# Patient Record
Sex: Female | Born: 1935 | Race: Black or African American | Hispanic: No | Smoking: Never smoker
Health system: Southern US, Community
[De-identification: ages and names within clinical notes are randomized; demographics above are authoritative.]

## PROBLEM LIST (undated history)

## (undated) DIAGNOSIS — E7800 Pure hypercholesterolemia, unspecified: Secondary | ICD-10-CM

## (undated) DIAGNOSIS — F0280 Dementia in other diseases classified elsewhere without behavioral disturbance: Secondary | ICD-10-CM

## (undated) DIAGNOSIS — G309 Alzheimer's disease, unspecified: Secondary | ICD-10-CM

## (undated) DIAGNOSIS — I10 Essential (primary) hypertension: Secondary | ICD-10-CM

## (undated) DIAGNOSIS — I209 Angina pectoris, unspecified: Secondary | ICD-10-CM

## (undated) DIAGNOSIS — I509 Heart failure, unspecified: Secondary | ICD-10-CM

## (undated) HISTORY — PX: ABDOMINAL HYSTERECTOMY: SHX81

## (undated) HISTORY — DX: Heart failure, unspecified: I50.9

## (undated) HISTORY — PX: THYROID LOBECTOMY: SHX420

## (undated) HISTORY — DX: Essential (primary) hypertension: I10

---

## 2013-07-18 ENCOUNTER — Emergency Department (HOSPITAL_BASED_OUTPATIENT_CLINIC_OR_DEPARTMENT_OTHER)
Admission: EM | Admit: 2013-07-18 | Discharge: 2013-07-18 | Disposition: A | Discharge status: Home / Self Care | Payer: Medicare PPO | Attending: Emergency Medicine | Admitting: Emergency Medicine

## 2013-07-18 ENCOUNTER — Encounter (HOSPITAL_BASED_OUTPATIENT_CLINIC_OR_DEPARTMENT_OTHER): Payer: Self-pay | Admitting: *Deleted

## 2013-07-18 ENCOUNTER — Emergency Department (HOSPITAL_BASED_OUTPATIENT_CLINIC_OR_DEPARTMENT_OTHER): Payer: Medicare PPO

## 2013-07-18 DIAGNOSIS — M25559 Pain in unspecified hip: Secondary | ICD-10-CM | POA: Insufficient documentation

## 2013-07-18 DIAGNOSIS — M25473 Effusion, unspecified ankle: Secondary | ICD-10-CM | POA: Insufficient documentation

## 2013-07-18 DIAGNOSIS — M25476 Effusion, unspecified foot: Secondary | ICD-10-CM | POA: Insufficient documentation

## 2013-07-18 DIAGNOSIS — M79604 Pain in right leg: Secondary | ICD-10-CM

## 2013-07-18 DIAGNOSIS — M79609 Pain in unspecified limb: Secondary | ICD-10-CM | POA: Insufficient documentation

## 2013-07-18 MED ORDER — ACETAMINOPHEN 325 MG PO TABS
650.0000 mg | ORAL_TABLET | Freq: Once | ORAL | Status: AC
  Administered 2013-07-18: 650 mg via ORAL
  Filled 2013-07-18: qty 2

## 2013-07-18 NOTE — Discharge Instructions (Signed)
RESOURCE GUIDE ° °Chronic Pain Problems: °Contact [facility]  [phone] °Patients need to be referred by their primary care doctor. ° °Insufficient Money for Medicine: °Contact United Way:  call (888) 892-1162 ° °No Primary Care Doctor: °- Call [facility]  [phone] - can help you locate a primary care doctor that  accepts your insurance, provides certain services, etc. °- Physician Referral Service- 1-800-533-3463 ° °Agencies that provide inexpensive medical care: °- [facility]  [phone] °- [facility]  [phone] °- [facility]  [phone] °- [facility]  832-4777 °- [facility]  [phone] °- [facility]  272-1050 ° °Medicaid-accepting [place] Providers: °- [facility]- [address] ° [phone], Mon-Fri 9am-7pm, Sat 9am-1pm °- [facility]- [address], [address] ° [phone] °- [facility]- [address], [address] ° [phone] °- [facility]- [address] [place] [address] ° [phone] °- [facility]- [address], [phone] ° Only accepts [place] Access Medicaid patients after they have their name  applied to their card ° °Self Pay (no insurance) in [place]: °- Sickle Cell Patients - [facility] ° [address], [phone] °- [facility]- [address] ° [phone] °      -     [facility]- [address] °      -     [facility]- see information above (Speak to Pam H if you do not have insurance) °      -  [facility] [place]- [address],  [phone] °      -  [facility]- [address] [place] [address], [phone] °      -  Dr Osei-Bonsu-  [address], [place], [phone] °      -  [facility] - [address], [phone] °      -  [facility]- [address] [place] [address], [phone], also  [address], [phone] °      -     [facility]- [address], [phone], 1st & 3rd Saturday °        every month, 10am-1pm ° -     [facility] °  [address], [place]. °  Phone:  [phone], Fax:  [phone]. Hours of Operation:  9 am - 6 pm, M-F. ° -     [facility] °  [address], [place] °  Phone: [phone], Fax: [phone]. Hours of Operation:  8:30 am - 5:30 pm, M-F. ° °[facility] °[address] °[place], [place] [address] °(336) 832-4777 ° °[facility] °[address] °[place], [place] [address] °(336) [phone] ° °1) Find a Doctor and Pay Out of Pocket °Although you won't have to find out who is covered by your insurance plan, it is a good idea to ask around and get recommendations. You will then need to call the office and see if the doctor you have chosen will accept you as a new patient and what types of options they offer for patients who are self-pay. Some doctors offer discounts or will set up payment plans for their patients who do not have insurance, but   you will need to ask so you aren't surprised when you get to your appointment. ° °2) Contact Your Local Health Department °Not all health departments have doctors that can see patients for sick visits, but many do, so it is worth a call to see if yours does. If you don't know where your local health department is, you can check in your phone book. The CDC also has a tool to help you locate your state's health department, and many state websites also have listings of all of their local health departments. ° °3) Find a Walk-in Clinic °If your illness is not likely to be very severe or complicated, you may want to try a walk in clinic. These are popping up all over the country in pharmacies, drugstores, and shopping centers. They're usually staffed by nurse practitioners or physician assistants that have been trained  to treat common illnesses and complaints. They're usually fairly quick and inexpensive. However, if you have serious medical issues or chronic medical problems, these are probably not your best option ° °STD Testing °- [place] [facility] [place], [facility], [address], [place], phone [phone] or [phone].  Monday - Friday, call for an appointment. °- [place] [facility] [place], [facility], [address], [place], phone [phone] or [phone].  Monday - Friday, call for an appointment. ° °Abuse/Neglect: °- [place] Child Abuse Hotline (336) 641-3795 °- [place] Child Abuse Hotline 800-378-5315 (After Hours) ° °Emergency Shelter:  [facility] (336) 271-5985 ° °Maternity Homes: °- [facility] (336) 275-9566 °- [facility] (704) 372-4663 ° °MRSA Hotline #:   832-7006 ° °Dental Assistance °If unable to pay or uninsured, contact:  [place] [facility] to become qualified for the adult dental clinic. ° °Patients with Medicaid: [facility] °[address], [phone] °[address], [phone] ° °If unable to pay, or uninsured, contact [place] [facility] ([phone] in [place], 842-7733 in [place]) to become qualified for the adult dental clinic ° °[facility] °[address] °[place], [place] [address] °(336) 272-4177 °www.[facility].com ° °Other Low-Cost Community Dental Services: °- [facility]- [address], [place], [place], [address], 723-1848, Ext. 123, 2nd and 4th Thursday of the month at 6:30am.  10 clients each day by appointment, can sometimes see walk-in patients if someone does not show for an appointment. °- [facility]- [address], [place], [place], [address], [phone] °- [facility]- [address], [place], [place], [address], [phone] °- [place]  [facility]- 342-8273 °- [facility]- 703-3100 °- [facility]- 570-6415 ° °     Behavioral Health Resources in the Community ° °Intensive Outpatient Programs: °[facility]      °[address] °[place], [place] °336-878-6098 °Both a day and evening program °      °[facility]     °[address]        °[place], [place] [address] °336-832-9800        ° °[facility] °[address] °[place] °336-882-2125 ° °[facility] °ACCESS LINE: 1-800-853-5163 or 336-641-4981 °[address] °[place], [place] [address] °Http://[email or web address][email or web address]/adult.htm ° ° °Substance Abuse Resources: °- [facility]  336-882-2125 °- [facility] 336-784-9470 °- [facility] 336-285-9073 °- [facility] 336-845-3988 °-   Residential & Outpatient Substance Abuse Program  800-659-3381 ° °Psychological Services: °- [facility]  832-9600 °- [facility]  378-7881 °- [facility], [address], [place], ACCESS LINE: 1-800-853-5163 or 336-641-4981, Http://www.[facility].com/services/adult.htm ° °Mobile Crisis Teams:         °                               °[facility]         °Mobile Crisis Care Unit °1-877-626-1772       °      °Assertive °[facility] °[address] [place] °336-834-9664 °                                        °Interventionist °Sharon DeEsch °[address] °[place] °336-554-5454 ° °Self-Help/Support Groups: °[facility] Variety of support groups °373-1402 (call for more info) ° °Narcotics Anonymous (NA) °[facility] °[address] °[place] - 2 meetings at this location ° °Residential Treatment Programs:  °[facility]      °[address]        °[place]       °866-801-8205        ° °[facility] °[address] °[place], [place]  [address] °704-293-8524 ° °[facility]  °[address] °[place], [place] [address] °336-845-3988 °Admissions: 8am-3pm M-F ° °[facility]     °[address]        °[place], [place] [address]       °336-841-1104        ° °[facility] °[address] °[place], [place] °336-379-7146 ° °[facility] °[address] °[place], [place] °336-285-9073 ° °[facility] - Intensive Outpatient      °[address]     °[place], [place]       °852-3033        ° °[facility]     °[address] °[place], [place] °877-615-2722 or 336-784-9470 ° °[facility]), Medicaid °[address] °[place], [place] °336-227-7417 ° °[facility]                                               °[address] °[place] °800-659-3381 ° °[place] [facility]: °[facility]- 1-888-581-9988              ° °General Therapy                                                °Julie Brannon, PhD        °[address]                                       °[place], [place] [address]         °336-349-5553   °Insurance ° °[facility]   °[address] °[place], [place] [address] °336-349-4454 ° °[facility] °[address] [place], [place] [address] °336-342-8316 °Insurance/Medicaid/sponsorship through [facility] ° °[facility]                                              °[address]                                        °[place], [place] [address]    °Therapy/tele-psych/case         °336-342-8316        °  °[facility] °[address]  ° [place], [place]  [address]  °Adolescent/group home/case management °336-349-2233  °                                         °Julia Brannon PhD       °General therapy       °Insurance   °336-951-0000        ° °Dr. Arfeen, Insurance, M-F °336- 349-4544 ° °[facility] [place]  [facility]  [employer] [place] [facility] °[address]                 [address]         [address]  °[place]                                               [place] °Phone:  349-3220                                  Phone:  [phone]                   Phone:  342-8140 ° °[facility], 342-8316 °- [place] [facility] - [facility]- 1-888-581-9988 °      -     [facility] in [place], [address], °            336-349-4454, Insurance ° °[place] Child Abuse Hotline °(336) 342-1394 or (336) 342-3537 (After Hours) ° ° °

## 2013-07-18 NOTE — ED Provider Notes (Signed)
CSN: [id number]     Arrival date & time 07/18/13  1353 History   First MD Initiated Contact with Patient 07/18/13 1459     Chief Complaint  Patient presents with  . Leg Pain   (Consider location/radiation/quality/duration/timing/severity/associated sxs/prior Treatment) Patient is a 79 y.o. female presenting with leg pain.  Leg Pain Location:  Hip Lower extremity injury: Several months ago.   Hip location:  R hip Pain details:    Quality:  Aching   Radiates to:  Does not radiate   Severity:  Moderate   Onset quality:  Gradual   Timing:  Intermittent   Progression:  Unchanged Chronicity:  Chronic Relieved by:  Nothing Worsened by:  Bearing weight and activity Ineffective treatments: Aspirin. Associated symptoms: no back pain, no fever, no muscle weakness, no swelling and no tingling     History reviewed. No pertinent past medical history. History reviewed. No pertinent past surgical history. No family history on file. History  Substance Use Topics  . Smoking status: Never Smoker   . Smokeless tobacco: Not on file  . Alcohol Use: No   OB History   Grav Para Term Preterm Abortions TAB SAB Ect Mult Living                 Review of Systems  Constitutional: Negative for fever.  HENT: Negative for congestion.   Respiratory: Negative for cough and shortness of breath.   Cardiovascular: Negative for chest pain.  Gastrointestinal: Negative for nausea, vomiting, abdominal pain and diarrhea.  Musculoskeletal: Negative for back pain.  All other systems reviewed and are negative.    Allergies  Review of patient's allergies indicates no known allergies.  Home Medications  No current outpatient prescriptions on file. BP 142/51  Pulse 70  Temp(Src) 98.1 F (36.7 C) (Oral)  Resp 18  Ht 5\' 3"  (1.6 m)  Wt 150 lb (68.04 kg)  BMI 26.58 kg/m2  SpO2 99% Physical Exam  Nursing note and vitals reviewed. Constitutional: She is oriented to person, place, and time. She appears  well-developed and well-nourished. No distress.  HENT:  Head: Normocephalic and atraumatic.  Eyes: Conjunctivae are normal. No scleral icterus.  Neck: Neck supple.  Cardiovascular: Normal rate and intact distal pulses.   Pulmonary/Chest: Effort normal. No stridor. No respiratory distress.  Abdominal: Normal appearance. She exhibits no distension.  Musculoskeletal:       Right hip: She exhibits tenderness. She exhibits normal range of motion, normal strength, no swelling, no crepitus, no deformity (Normal distal pulses. Normal distal strength and sensation.) and no laceration.       Right ankle: She exhibits swelling. She exhibits normal range of motion, no deformity and normal pulse. No tenderness.  Neurological: She is alert and oriented to person, place, and time.  Skin: Skin is warm and dry. No rash noted.  Psychiatric: She has a normal mood and affect. Her behavior is normal.    ED Course  Procedures (including critical care time) Labs Review Labs Reviewed - No data to display Imaging Review Dg Hip Complete Right  07/18/2013   *RADIOLOGY REPORT*  Clinical Data: Right hip and leg pain, fell a while back  RIGHT HIP - COMPLETE 2+ VIEW  Comparison: None  Findings: Hip and SI joints symmetric and preserved. Bones appear demineralized. No acute fracture, dislocation or bone destruction.  IMPRESSION: No acute osseous abnormalities.   Original Report Authenticated By: Ulyses Southward, M.D.  All radiology studies independently viewed by me.     MDM  1. Right leg pain    79 year old female who was brought to the ED by her son do to right hip pain and because she does not have a primary care provider. Her right hip has been bothering her intermittently for many months. She thinks it was secondary to a fall that she is up sustained some time ago. No deformities, normal pulses, normal sensation and motor. She does have some swelling in her ankle, but she is unsure of the duration of this. She has no  pain or tenderness here. swElling is not consistent with DVT. Plan to check a plain film of right hip and unclear history and report of fall. However anticipate that she will need to establish care with a primary care doctor.  Plain films negative. Tylenol for pain.  Will dc home with resources for a primary doctor.  Candyce Churn, MD 07/18/13 [phone]

## 2013-07-18 NOTE — ED Notes (Signed)
Reports of Pt. Fell in Jan. 2014 and now the pain in the R upper leg is getting worse.  Pt. Does have edema noted in the R and L lower legs and ankles.  More edema noted in the R leg.  Pt. Does walk with pain in the  R leg.

## 2013-08-06 ENCOUNTER — Other Ambulatory Visit: Payer: Self-pay | Admitting: Obstetrics

## 2013-08-06 DIAGNOSIS — N390 Urinary tract infection, site not specified: Secondary | ICD-10-CM

## 2013-08-06 MED ORDER — NITROFURANTOIN MONOHYD MACRO 100 MG PO CAPS: 100.0000 mg | ORAL_CAPSULE | Freq: Two times a day (BID) | ORAL | Status: DC

## 2015-05-05 ENCOUNTER — Emergency Department (HOSPITAL_COMMUNITY)
Admission: EM | Admit: 2015-05-05 | Discharge: 2015-05-05 | Disposition: A | Discharge status: Home / Self Care | Payer: Medicare PPO | Attending: Emergency Medicine | Admitting: Emergency Medicine

## 2015-05-05 ENCOUNTER — Encounter (HOSPITAL_COMMUNITY): Payer: Self-pay | Admitting: Emergency Medicine

## 2015-05-05 DIAGNOSIS — G309 Alzheimer's disease, unspecified: Secondary | ICD-10-CM | POA: Diagnosis not present

## 2015-05-05 DIAGNOSIS — E780 Pure hypercholesterolemia: Secondary | ICD-10-CM | POA: Insufficient documentation

## 2015-05-05 DIAGNOSIS — F0390 Unspecified dementia without behavioral disturbance: Secondary | ICD-10-CM

## 2015-05-05 DIAGNOSIS — Z79899 Other long term (current) drug therapy: Secondary | ICD-10-CM | POA: Diagnosis not present

## 2015-05-05 DIAGNOSIS — Z8679 Personal history of other diseases of the circulatory system: Secondary | ICD-10-CM | POA: Diagnosis not present

## 2015-05-05 DIAGNOSIS — R451 Restlessness and agitation: Secondary | ICD-10-CM | POA: Diagnosis present

## 2015-05-05 DIAGNOSIS — F0280 Dementia in other diseases classified elsewhere without behavioral disturbance: Secondary | ICD-10-CM | POA: Diagnosis not present

## 2015-05-05 DIAGNOSIS — Z7982 Long term (current) use of aspirin: Secondary | ICD-10-CM | POA: Diagnosis not present

## 2015-05-05 HISTORY — DX: Alzheimer's disease, unspecified: G30.9

## 2015-05-05 HISTORY — DX: Dementia in other diseases classified elsewhere, unspecified severity, without behavioral disturbance, psychotic disturbance, mood disturbance, and anxiety: F02.80

## 2015-05-05 HISTORY — DX: Angina pectoris, unspecified: I20.9

## 2015-05-05 HISTORY — DX: Pure hypercholesterolemia, unspecified: E78.00

## 2015-05-05 LAB — COMPREHENSIVE METABOLIC PANEL
ALK PHOS: 100 U/L (ref 38–126)
ALT: 13 U/L — AB (ref 14–54)
AST: 23 U/L (ref 15–41)
Albumin: 4.1 g/dL (ref 3.5–5.0)
Anion gap: 11 (ref 5–15)
BILIRUBIN TOTAL: 0.7 mg/dL (ref 0.3–1.2)
BUN: 14 mg/dL (ref 6–20)
CHLORIDE: 105 mmol/L (ref 101–111)
CO2: 25 mmol/L (ref 22–32)
Calcium: 9.3 mg/dL (ref 8.9–10.3)
Creatinine, Ser: 0.67 mg/dL (ref 0.44–1.00)
GLUCOSE: 90 mg/dL (ref 65–99)
POTASSIUM: 3.2 mmol/L — AB (ref 3.5–5.1)
Sodium: 141 mmol/L (ref 135–145)
TOTAL PROTEIN: 7.9 g/dL (ref 6.5–8.1)

## 2015-05-05 LAB — CBC WITH DIFFERENTIAL/PLATELET
BASOS ABS: 0 10*3/uL (ref 0.0–0.1)
BASOS PCT: 1 % (ref 0–1)
EOS PCT: 2 % (ref 0–5)
Eosinophils Absolute: 0.1 10*3/uL (ref 0.0–0.7)
HEMATOCRIT: 41.9 % (ref 36.0–46.0)
HEMOGLOBIN: 13.3 g/dL (ref 12.0–15.0)
Lymphocytes Relative: 30 % (ref 12–46)
Lymphs Abs: 1.8 10*3/uL (ref 0.7–4.0)
MCH: 28.3 pg (ref 26.0–34.0)
MCHC: 31.7 g/dL (ref 30.0–36.0)
MCV: 89.1 fL (ref 78.0–100.0)
MONO ABS: 0.7 10*3/uL (ref 0.1–1.0)
MONOS PCT: 11 % (ref 3–12)
Neutro Abs: 3.3 10*3/uL (ref 1.7–7.7)
Neutrophils Relative %: 56 % (ref 43–77)
Platelets: 330 10*3/uL (ref 150–400)
RBC: 4.7 MIL/uL (ref 3.87–5.11)
RDW: 13.4 % (ref 11.5–15.5)
WBC: 5.8 10*3/uL (ref 4.0–10.5)

## 2015-05-05 LAB — URINALYSIS, ROUTINE W REFLEX MICROSCOPIC
Glucose, UA: NEGATIVE mg/dL
HGB URINE DIPSTICK: NEGATIVE
KETONES UR: NEGATIVE mg/dL
LEUKOCYTES UA: NEGATIVE
Nitrite: NEGATIVE
Protein, ur: NEGATIVE mg/dL
SPECIFIC GRAVITY, URINE: 1.027 (ref 1.005–1.030)
UROBILINOGEN UA: 1 mg/dL (ref 0.0–1.0)
pH: 5.5 (ref 5.0–8.0)

## 2015-05-05 LAB — RAPID URINE DRUG SCREEN, HOSP PERFORMED
Amphetamines: NOT DETECTED
BARBITURATES: NOT DETECTED
Benzodiazepines: NOT DETECTED
COCAINE: NOT DETECTED
OPIATES: NOT DETECTED
Tetrahydrocannabinol: NOT DETECTED

## 2015-05-05 LAB — ETHANOL

## 2015-05-05 NOTE — ED Notes (Signed)
Pt with daughter, daughter states pt has been becoming very agitated and running away from home. Pt lives with daughter. Pt calm and cooperative in triage.

## 2015-05-05 NOTE — BH Assessment (Signed)
Writer informed TTS ([place]) of the consult.  
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
## 2015-05-05 NOTE — ED Provider Notes (Signed)
CSN: [id number]     Arrival date & time 05/05/15  6606 History   First MD Initiated Contact with Patient 05/05/15 0957     Chief Complaint  Patient presents with  . Agitation     (Consider location/radiation/quality/duration/timing/severity/associated sxs/prior Treatment) Patient is a 81 y.o. female presenting with altered mental status. The history is provided by a relative (the daughter states the pt wanted to hurt someone).  Altered Mental Status Presenting symptoms: behavior changes   Severity:  Moderate Most recent episode:  Today Episode history:  Multiple Timing:  Intermittent Chronicity:  Recurrent Context: dementia   Associated symptoms: agitation   Associated symptoms: no abdominal pain, no headaches, no rash and no seizures     Past Medical History  Diagnosis Date  . Alzheimer disease   . Anginal pain   . Hypercholesteremia    Past Surgical History  Procedure Laterality Date  . Abdominal hysterectomy     No family history on file. History  Substance Use Topics  . Smoking status: Never Smoker   . Smokeless tobacco: Not on file  . Alcohol Use: No   OB History    No data available     Review of Systems  Constitutional: Negative for appetite change and fatigue.  HENT: Negative for congestion, ear discharge and sinus pressure.   Eyes: Negative for discharge.  Respiratory: Negative for cough.   Cardiovascular: Negative for chest pain.  Gastrointestinal: Negative for abdominal pain and diarrhea.  Genitourinary: Negative for frequency and hematuria.  Musculoskeletal: Negative for back pain.  Skin: Negative for rash.  Neurological: Negative for seizures and headaches.  Psychiatric/Behavioral: Positive for agitation.      Allergies  Review of patient's allergies indicates no known allergies.  Home Medications   Prior to Admission medications   Medication Sig Start Date End Date Taking? Authorizing Provider  aspirin 81 MG tablet Take 81 mg by mouth  daily.   Yes Historical Provider, MD  donepezil (ARICEPT) 5 MG tablet Take 5 mg by mouth daily. 04/19/15  Yes Historical Provider, MD  pravastatin (PRAVACHOL) 10 MG tablet Take 10 mg by mouth daily. 02/21/15  Yes Historical Provider, MD  zolpidem (AMBIEN) 10 MG tablet Take 5-10 mg by mouth at bedtime as needed for sleep.  04/27/15  Yes Historical Provider, MD  nitrofurantoin, macrocrystal-monohydrate, (MACROBID) 100 MG capsule Take 1 capsule (100 mg total) by mouth 2 (two) times daily. Patient not taking: Reported on 05/05/2015 08/06/13   Brock Bad, MD   BP 142/40 mmHg  Pulse 77  Temp(Src) 98.3 F (36.8 C) (Oral)  Resp 16  SpO2 97% Physical Exam  Constitutional: She is oriented to person, place, and time. She appears well-developed.  HENT:  Head: Normocephalic.  Eyes: Conjunctivae and EOM are normal. No scleral icterus.  Neck: Neck supple. No thyromegaly present.  Cardiovascular: Normal rate and regular rhythm.  Exam reveals no gallop and no friction rub.   No murmur heard. Pulmonary/Chest: No stridor. She has no wheezes. She has no rales. She exhibits no tenderness.  Abdominal: She exhibits no distension. There is no tenderness. There is no rebound.  Musculoskeletal: Normal range of motion. She exhibits no edema.  Lymphadenopathy:    She has no cervical adenopathy.  Neurological: She is oriented to person, place, and time. She exhibits normal muscle tone. Coordination normal.  Skin: No rash noted. No erythema.  Psychiatric:  Pt aggitated    ED Course  Procedures (including critical care time) Labs Review Labs Reviewed  COMPREHENSIVE METABOLIC PANEL - Abnormal; Notable for the following:    Potassium 3.2 (*)    ALT 13 (*)    All other components within normal limits  URINALYSIS, ROUTINE W REFLEX MICROSCOPIC (NOT AT [facility]) - Abnormal; Notable for the following:    Color, Urine AMBER (*)    APPearance CLOUDY (*)    Bilirubin Urine SMALL (*)    All other components within  normal limits  CBC WITH DIFFERENTIAL/PLATELET  URINE RAPID DRUG SCREEN, HOSP PERFORMED  ETHANOL    Imaging Review No results found.   EKG Interpretation None      MDM   Final diagnoses:  Dementia, without behavioral disturbance    Pt seen by Child psychotherapist.   Pt with dementia  And will follow up with pcp   Bethann Berkshire, MD 05/05/15 1539

## 2015-05-05 NOTE — ED Notes (Signed)
MD at bedside. EDP ZAMMIT 

## 2015-05-05 NOTE — Progress Notes (Addendum)
Pt in room with daughter at bedside Pt states she is here in [place] with daughter,  Dois Davenport "my oldest and only daughter' States she lives with Dois Davenport after she left her home States she and her mother lived with Dois Davenport until she her mother passed.  Can not recall the date of today but can recall she is at "the hospital" unable to tell CM what hospital.  Unable to tell pt the date but told Cm she went to a "all girls school in [place]" Pt can not recall why she is in ED Pt mentioned before cm could redirect her that "some people think I am losing my mind and I'm going crazy" Pt apologized to daughter stating she "brought this to you and your home"  Pt briefly tearful  Daughter sitting in room quietly but observing and correcting mother answers as needed Cm shared with pt and daughter that generally neurologist can assess for memory losses Dois Davenport reports pt has not been seen by a neurologist yet.  Pt states she will be willing to see a neurologist and answer questions but "mya get a little quiet when I don't want to answer questions. I can get an attitude"  PCP updated as brian mackenzie (offered by sandra pt could not recall pcp name States she had one last in last home town)   1534 Cm sent pcp a noted vis [facility] in basket about ED visit, HH orders and possible need for neurology referral Cm left a voice message for Caryn Bee about [facility] /SW Pending call Request Daughter sandra be called Left her number Provided pt/daughter with list of [facility] medicare PPO neurologist Instructed Dois Davenport with how to get to find a doctor for [employer] PPO and gave her copies of directions for future use Reports she has pt set up with [facility] 2 times a week  Updated EDP  1617 return call from [place] stating they are not able to provide home service for humana pt.   1740 CM called and left a message at [phone] informing Dois Davenport that [facility] not able to provide home service for humana pt CM encouraged her to  return call to cm office number to provide other choice of [facility] agency

## 2015-05-05 NOTE — Discharge Instructions (Signed)
Follow up with your family md and follow up with the social work help

## 2015-05-05 NOTE — Progress Notes (Signed)
1111 CM consulted by ED Sw about needed UA for pt  Cm spoke with Dr Estell Harpin Noted entered UA at 1110

## 2015-05-05 NOTE — ED Notes (Signed)
Unable to obtain labs at this time, ACT team is in the room speaking with her. Nurse was made aware that labs will be drawn once they are finish speaking with her.

## 2015-05-05 NOTE — ED Notes (Signed)
TTS speaking with family/daughter

## 2015-05-05 NOTE — Progress Notes (Signed)
Per EDP, patient psychiatrically and medically stable for dc home with support from home health services. CSW also provided assisted living places for future possible placement. Pt plasn to return home with pt daughter.   Olga Coaster, LCSW  Clinical Social Work  [employer] [phone]

## 2015-05-06 NOTE — Progress Notes (Addendum)
[facility] ED CM received a return call from Dois Davenport stating her [facility] [place] CM consulted humana medicare ppo find a agency list online [phone] Cm called [facility] Spoke with farrah who confirms no present [facility] RN disease management program [phone] Cm called [facility] with daphanie who confirms no present BH RN disease management program 0945 Cm called [facility] Spoke with staff who confirms no present BH RN disease management program [phone] Cm called [facility] Spoke with tara who confirms no present BH RN disease management program but [facility] can accommodate providing pt with a HHRN to provided disease management and medication administration 0952 Cm called [facility] Spoke with kristen who confirms no present BH RN disease management program but [facility] can accommodate providing pt with a HHRN to provided disease management and medication administration 0954 Cm called [facility] Spoke with staff who confirms no present BH RN disease management program [phone] CM spoke with Dois Davenport at [phone] to updated her that [facility] and [facility] can accommodate providing pt with a HHRN to provided disease management and medication administration. Dois Davenport to contact CM back with choice vis CM mobile #  [phone] CM received a call from [facility] CMA about Delice Bison return call from [place]. CM returned a call Delice Bison preference is to receive referral information pending dtr return call for choice of agency 1202 CM received fax confirmation for clinicals sent to Arthor Captain # [phone] Cm received a return call from [place] with [facility]. CM left Kristen of [facility] a voice message providing a referral for [facility] for disease management and medication administration and HHSW   1611 Kristen of advanced confirmed referral received for pt Cm called and updated Tara at [phone] of [facility]

## 2015-05-17 ENCOUNTER — Emergency Department (HOSPITAL_COMMUNITY)
Admission: EM | Admit: 2015-05-17 | Discharge: 2015-05-18 | Disposition: A | Discharge status: Home / Self Care | Payer: Medicare PPO | Attending: Emergency Medicine | Admitting: Emergency Medicine

## 2015-05-17 ENCOUNTER — Encounter (HOSPITAL_COMMUNITY): Payer: Self-pay | Admitting: Family Medicine

## 2015-05-17 DIAGNOSIS — F0391 Unspecified dementia with behavioral disturbance: Secondary | ICD-10-CM

## 2015-05-17 DIAGNOSIS — E780 Pure hypercholesterolemia: Secondary | ICD-10-CM | POA: Insufficient documentation

## 2015-05-17 DIAGNOSIS — Z7982 Long term (current) use of aspirin: Secondary | ICD-10-CM | POA: Diagnosis not present

## 2015-05-17 DIAGNOSIS — G309 Alzheimer's disease, unspecified: Secondary | ICD-10-CM | POA: Diagnosis not present

## 2015-05-17 DIAGNOSIS — I209 Angina pectoris, unspecified: Secondary | ICD-10-CM | POA: Insufficient documentation

## 2015-05-17 DIAGNOSIS — F0280 Dementia in other diseases classified elsewhere without behavioral disturbance: Secondary | ICD-10-CM | POA: Diagnosis not present

## 2015-05-17 DIAGNOSIS — Z79899 Other long term (current) drug therapy: Secondary | ICD-10-CM | POA: Insufficient documentation

## 2015-05-17 DIAGNOSIS — R4182 Altered mental status, unspecified: Secondary | ICD-10-CM | POA: Diagnosis present

## 2015-05-17 NOTE — ED Notes (Signed)
Patient has a history of dementia. Pt's daughter reports increased altered mental status than her usual self. Pt states their has been some stuff going and she is at blame. She states she is the oldest, "I take all the blame". Pt states is alert, and states "honey, right now I probably don't even know my name".

## 2015-05-18 NOTE — ED Provider Notes (Signed)
CSN: [id number]     Arrival date & time 05/17/15  2307 History   First MD Initiated Contact with Patient 05/18/15 0106     Chief Complaint  Patient presents with  . Altered Mental Status     (Consider location/radiation/quality/duration/timing/severity/associated sxs/prior Treatment) Patient is a 81 y.o. female presenting with altered mental status. The history is provided by the patient and a relative. No language interpreter was used.  Altered Mental Status Presenting symptoms: behavior changes, confusion and memory loss   Associated symptoms: no fever   Associated symptoms comment:  Per the patient's daughter, the patient woke her from sleep with complaint of abdominal pain and "I feel warm". She had a normal day, ate well, and had no complaints. The patient does not remember, and denies waking her daughter up or having any pain or other symptoms. She lives with her daughter. Daughter reports progressive symptoms of memory loss and behavior changes over the last several months, worse at time more than others.    Past Medical History  Diagnosis Date  . Alzheimer disease   . Anginal pain   . Hypercholesteremia    Past Surgical History  Procedure Laterality Date  . Abdominal hysterectomy     History reviewed. No pertinent family history. History  Substance Use Topics  . Smoking status: Never Smoker   . Smokeless tobacco: Not on file  . Alcohol Use: No   OB History    No data available     Review of Systems  Constitutional: Negative for fever and chills.  Respiratory: Negative.   Cardiovascular: Negative.   Gastrointestinal: Negative.   Musculoskeletal: Negative.   Skin: Negative.   Neurological: Negative.   Psychiatric/Behavioral: Positive for memory loss and confusion.      Allergies  Review of patient's allergies indicates no known allergies.  Home Medications   Prior to Admission medications   Medication Sig Start Date End Date Taking? Authorizing Provider   aspirin 81 MG tablet Take 81 mg by mouth daily.    Historical Provider, MD  donepezil (ARICEPT) 5 MG tablet Take 5 mg by mouth daily. 04/19/15   Historical Provider, MD  nitrofurantoin, macrocrystal-monohydrate, (MACROBID) 100 MG capsule Take 1 capsule (100 mg total) by mouth 2 (two) times daily. Patient not taking: Reported on 05/05/2015 08/06/13   Brock Bad, MD  pravastatin (PRAVACHOL) 10 MG tablet Take 10 mg by mouth daily. 02/21/15   Historical Provider, MD  zolpidem (AMBIEN) 10 MG tablet Take 5-10 mg by mouth at bedtime as needed for sleep.  04/27/15   Historical Provider, MD   BP 189/58 mmHg  Pulse 72  Temp(Src) 97.8 F (36.6 C) (Oral)  Resp 18  SpO2 96% Physical Exam  Constitutional: She appears well-developed and well-nourished.  HENT:  Head: Normocephalic.  Neck: Normal range of motion. Neck supple.  Cardiovascular: Normal rate and regular rhythm.   No murmur heard. Pulmonary/Chest: Effort normal and breath sounds normal. She has no wheezes. She has no rales.  Abdominal: Soft. Bowel sounds are normal. There is no tenderness. There is no rebound and no guarding.  Musculoskeletal: Normal range of motion. She exhibits no edema.  Neurological: She is alert.  Skin: Skin is warm and dry. No rash noted.  Psychiatric: She has a normal mood and affect.    ED Course  Procedures (including critical care time) Labs Review Labs Reviewed - No data to display  Imaging Review No results found.   EKG Interpretation None  MDM   Final diagnoses:  None    1. Dementia  Symptoms of memory loss and behavior changes are ongoing and have been discussed with the patient's doctor Thayer Headings(Brian MacKenzie). The patient currently has no complaints, has normal VS and a completely normal exam without abnormality or tenderness. She is felt stable for discharge home and daughter is encouraged to follow up this week with her doctor for further assistance with worsening memory loss  dementia.    Elpidio AnisShari Laurel Smeltz, PA-C 05/18/15 0127  Derwood KaplanAnkit Nanavati, MD 05/19/15 [phone]

## 2015-05-18 NOTE — Discharge Instructions (Signed)
Dementia °Dementia is a word that is used to describe problems with the brain and how it works. People with dementia have memory loss. They may also have problems with thinking, speaking, or solving problems. It can affect how they act around people, how they do their job, their mood, and their personality. These changes may not show up for a long time. Family or friends may not notice problems in the early part of this disease. °HOME CARE °The following tips are for the person living with, or caring for, the person with dementia. °Make the home safe. °· Remove locks on bathroom doors. °· Use childproof locks on cabinets where alcohol, cleaning supplies, or chemicals are stored. °· Put outlet covers in electrical outlets. °· Put in childproof locks to keep doors and windows safe. °· Remove stove knobs, or put in safety knobs that shut off on their own. °· Lower the temperature on water heaters. °· Label medicines. Lock them in a safe place. °· Keep knives, lighters, matches, power tools, and guns out of reach or in a safe place. °· Remove objects that might break or can hurt the person. °· Make sure lighting is good inside and outside. °· Put in grab bars if needed. °· Use a device that detects falls or other needs for help. °Lessen confusion. °· Keep familiar objects and people around. °· Use night lights or low lit (dim) lights at night. °· Label objects or areas. °· Use reminders, notes, or directions for daily activities or tasks. °· Keep a simple routine that is the same for waking, meals, bathing, dressing, and bedtime. °· Create a calm and quiet home. °· Put up clocks and calendars. °· Keep emergency numbers and the home address near all phones. °· Help show the different times of day. Open the curtains during the day to let light in. °Speak clearly and directly. °· Choose simple words and short sentences. °· Use a gentle, calm voice. °· Do not interrupt. °· If the person has a hard time finding a word to  use, give them the word or thought. °· Ask 1 question at a time. Give enough time for the person to answer. Repeat the question if the person does not answer. °Do things that lessen restlessness. °· Provide a comfortable bed. °· Have the same bedtime routine every night. °· Have a regular walking and activity schedule. °· Lessen naps during the day. °· Do not let the person drink a lot of caffeine. °· Go to events that are not overwhelming. °Eat well and drink fluids. °· Lessen distractions during meal times and snacks. °· Avoid foods that are too hot or too cold. °· Watch how the person chews and swallows. This is to make sure they do not choke. °Other °· Keep all vision, hearing, dental, and medical visits with the doctor. °· Only give medicines as told by the doctor. °· Watch the person's driving ability. Do not let the person drive if he or she cannot drive safely. °· Use a program that helps find a person if they become missing. You may need to register with this program. °GET HELP RIGHT AWAY IF:  °· A fever of 102° F (38.9° C) develops. °· Confusion develops or gets worse. °· Sleepiness develops or gets worse. °· Staying awake is hard to do. °· New behavior problems start like mood swings, aggression, and seeing things that are not there. °· Problems with balance, speech, or falling develop. °· Problems swallowing develop. °· Any   problems of another sickness develop. °MAKE SURE YOU: °· Understand these instructions. °· Will watch his or her condition. °· Will get help right away if he or she is not doing well or gets worse. °Document Released: 10/20/2008 Document Revised: 01/30/2012 Document Reviewed: 04/04/2011 °ExitCare® Patient Information ©2015 [employer]. This information is not intended to replace advice given to you by your health care provider. Make sure you discuss any questions you have with your health care provider. ° °

## 2015-05-25 ENCOUNTER — Emergency Department (HOSPITAL_COMMUNITY)
Admission: EM | Admit: 2015-05-25 | Discharge: 2015-05-26 | Disposition: A | Discharge status: Home / Self Care | Payer: Medicare PPO | Attending: Emergency Medicine | Admitting: Emergency Medicine

## 2015-05-25 ENCOUNTER — Encounter (HOSPITAL_COMMUNITY): Payer: Self-pay | Admitting: *Deleted

## 2015-05-25 DIAGNOSIS — Z7982 Long term (current) use of aspirin: Secondary | ICD-10-CM | POA: Diagnosis not present

## 2015-05-25 DIAGNOSIS — F0390 Unspecified dementia without behavioral disturbance: Secondary | ICD-10-CM | POA: Diagnosis present

## 2015-05-25 DIAGNOSIS — F0281 Dementia in other diseases classified elsewhere with behavioral disturbance: Secondary | ICD-10-CM | POA: Diagnosis not present

## 2015-05-25 DIAGNOSIS — F0391 Unspecified dementia with behavioral disturbance: Secondary | ICD-10-CM

## 2015-05-25 DIAGNOSIS — G309 Alzheimer's disease, unspecified: Secondary | ICD-10-CM | POA: Insufficient documentation

## 2015-05-25 DIAGNOSIS — I209 Angina pectoris, unspecified: Secondary | ICD-10-CM | POA: Insufficient documentation

## 2015-05-25 DIAGNOSIS — Z79899 Other long term (current) drug therapy: Secondary | ICD-10-CM | POA: Insufficient documentation

## 2015-05-25 DIAGNOSIS — Z9183 Wandering in diseases classified elsewhere: Secondary | ICD-10-CM | POA: Diagnosis not present

## 2015-05-25 DIAGNOSIS — F03918 Unspecified dementia, unspecified severity, with other behavioral disturbance: Secondary | ICD-10-CM | POA: Diagnosis present

## 2015-05-25 DIAGNOSIS — E780 Pure hypercholesterolemia: Secondary | ICD-10-CM | POA: Insufficient documentation

## 2015-05-25 LAB — CBC WITH DIFFERENTIAL/PLATELET
BASOS PCT: 1 % (ref 0–1)
Basophils Absolute: 0 10*3/uL (ref 0.0–0.1)
Eosinophils Absolute: 0.2 10*3/uL (ref 0.0–0.7)
Eosinophils Relative: 4 % (ref 0–5)
HCT: 40 % (ref 36.0–46.0)
HEMOGLOBIN: 12.7 g/dL (ref 12.0–15.0)
LYMPHS ABS: 1.9 10*3/uL (ref 0.7–4.0)
Lymphocytes Relative: 30 % (ref 12–46)
MCH: 28.5 pg (ref 26.0–34.0)
MCHC: 31.8 g/dL (ref 30.0–36.0)
MCV: 89.7 fL (ref 78.0–100.0)
MONOS PCT: 8 % (ref 3–12)
Monocytes Absolute: 0.5 10*3/uL (ref 0.1–1.0)
NEUTROS ABS: 3.6 10*3/uL (ref 1.7–7.7)
NEUTROS PCT: 57 % (ref 43–77)
Platelets: 312 10*3/uL (ref 150–400)
RBC: 4.46 MIL/uL (ref 3.87–5.11)
RDW: 13.5 % (ref 11.5–15.5)
WBC: 6.3 10*3/uL (ref 4.0–10.5)

## 2015-05-25 LAB — URINALYSIS, ROUTINE W REFLEX MICROSCOPIC
BILIRUBIN URINE: NEGATIVE
GLUCOSE, UA: NEGATIVE mg/dL
Hgb urine dipstick: NEGATIVE
Ketones, ur: NEGATIVE mg/dL
Leukocytes, UA: NEGATIVE
NITRITE: NEGATIVE
PH: 6 (ref 5.0–8.0)
Protein, ur: NEGATIVE mg/dL
SPECIFIC GRAVITY, URINE: 1.019 (ref 1.005–1.030)
Urobilinogen, UA: 1 mg/dL (ref 0.0–1.0)

## 2015-05-25 MED ORDER — LORAZEPAM 1 MG PO TABS: 1.0000 mg | ORAL_TABLET | Freq: Three times a day (TID) | ORAL | Status: DC | PRN

## 2015-05-25 MED ORDER — ZOLPIDEM TARTRATE 5 MG PO TABS: 5.0000 mg | ORAL_TABLET | Freq: Every evening | ORAL | Status: DC | PRN

## 2015-05-25 NOTE — ED Notes (Signed)
Pt brought in via [employer], family is a [employer] office taking out IVC papers; pt denies SI / HI; pt states that he her family doesn't want to care for her any longer and wants to put her in a nursing home; pt states that she doesn't understand what is wrong with this generation and why they don't feel the responsibility to care for her; pt states that she quit her job and cared for her elderly mother until she passed; pt states that she has problems with her oldest son and that he doesn't make great life decisions; pt states that she had to get [place] son from [place] bc his life was in danger and now he does nothing but disrespect her; pt unable to state year or month but is alert and oriented to self and situation; pt able to state her birthday

## 2015-05-25 NOTE — Progress Notes (Signed)
CSW met with patient at bedside. There was no family present. Per note, the pt was BIB [employer] and family is at [employer] office taking out IVC papers.  CSW asked pt why she presents to [facility]; patient states that her and her family do not get along. Patient stated " They just think I'm Crazy." Patient denies SI/HI and AH/ VH.  Patient informed CSW that she lives at home in [place] with her son and daughter. The pt states that she has been staying there for the past 3 years. Patient informed CSW that she completes all of her ADL's independently and that she does not fall often.  Patient informed CSW that her mother passed about 2 months ago.  CSW gave pt a sandwich and soda.  Patient states that she does not not have any other questions at this time. Patient was cooperative during this interview.  Michele Cervantes [phone] ED CSW 05/25/2015 11:00 PM

## 2015-05-25 NOTE — ED Provider Notes (Signed)
CSN: [id number]     Arrival date & time 05/25/15  2120 History   First MD Initiated Contact with Patient 05/25/15 2212     Chief Complaint  Patient presents with  . Dementia     (Consider location/radiation/quality/duration/timing/severity/associated sxs/prior Treatment) Patient is a 81 y.o. female presenting with mental health disorder. The history is provided by the patient. No language interpreter was used.  Mental Health Problem Presenting symptoms: disorganized thought process   Patient accompanied by:  Law enforcement Degree of incapacity (severity):  Moderate Onset quality:  Gradual Timing:  Constant Progression:  Worsening Chronicity:  New Context: noncompliance   Relieved by:  Nothing Ineffective treatments:  None tried Pt here with police with IVC papers.  Pt is reported to have threatened family with a knife and threatened to lay down in the road.  Pt has a history of dementia. Pt gives a rambling history.   Past Medical History  Diagnosis Date  . Alzheimer disease   . Anginal pain   . Hypercholesteremia    Past Surgical History  Procedure Laterality Date  . Abdominal hysterectomy     No family history on file. History  Substance Use Topics  . Smoking status: Never Smoker   . Smokeless tobacco: Not on file  . Alcohol Use: No   OB History    No data available     Review of Systems  All other systems reviewed and are negative.     Allergies  Review of patient's allergies indicates no known allergies.  Home Medications   Prior to Admission medications   Medication Sig Start Date End Date Taking? Authorizing Provider  aspirin 81 MG tablet Take 81 mg by mouth daily.   Yes Historical Provider, MD  donepezil (ARICEPT) 5 MG tablet Take 5 mg by mouth daily. 04/19/15  Yes Historical Provider, MD  pravastatin (PRAVACHOL) 10 MG tablet Take 10 mg by mouth daily. 02/21/15  Yes Historical Provider, MD  zolpidem (AMBIEN) 10 MG tablet Take 5-10 mg by mouth at  bedtime as needed for sleep.  04/27/15  Yes Historical Provider, MD  nitrofurantoin, macrocrystal-monohydrate, (MACROBID) 100 MG capsule Take 1 capsule (100 mg total) by mouth 2 (two) times daily. Patient not taking: Reported on 05/05/2015 08/06/13   Brock Badharles A Harper, MD   BP 183/63 mmHg  Pulse 98  Temp(Src) 98.3 F (36.8 C) (Oral)  Resp 20  SpO2 97% Physical Exam  Constitutional: She appears well-developed and well-nourished.  HENT:  Head: Normocephalic.  Right Ear: External ear normal.  Left Ear: External ear normal.  Nose: Nose normal.  Mouth/Throat: Oropharynx is clear and moist.  Eyes: Conjunctivae and EOM are normal. Pupils are equal, round, and reactive to light.  Neck: Normal range of motion. Neck supple.  Cardiovascular: Normal rate and normal heart sounds.   Pulmonary/Chest: Effort normal.  Abdominal: Soft.  Musculoskeletal: Normal range of motion.  Neurological: She is alert.  Confused, oriented to self  Skin: Skin is warm.  Nursing note and vitals reviewed.   ED Course  Procedures (including critical care time) Labs Review Labs Reviewed - No data to display  Imaging Review No results found.   EKG Interpretation None      MDM Tts consult.     Final diagnoses:  Dementia, with behavioral disturbance        Elson AreasLeslie K Sofia, PA-C 05/26/15 0001  Derwood KaplanAnkit Nanavati, MD 05/27/15 0104

## 2015-05-26 DIAGNOSIS — F03918 Unspecified dementia, unspecified severity, with other behavioral disturbance: Secondary | ICD-10-CM | POA: Diagnosis present

## 2015-05-26 DIAGNOSIS — F0391 Unspecified dementia with behavioral disturbance: Secondary | ICD-10-CM | POA: Diagnosis present

## 2015-05-26 DIAGNOSIS — R413 Other amnesia: Secondary | ICD-10-CM

## 2015-05-26 LAB — RAPID URINE DRUG SCREEN, HOSP PERFORMED
Amphetamines: NOT DETECTED
BARBITURATES: NOT DETECTED
BENZODIAZEPINES: NOT DETECTED
COCAINE: NOT DETECTED
Opiates: NOT DETECTED
Tetrahydrocannabinol: NOT DETECTED

## 2015-05-26 LAB — COMPREHENSIVE METABOLIC PANEL
ALK PHOS: 94 U/L (ref 38–126)
ALT: 14 U/L (ref 14–54)
AST: 21 U/L (ref 15–41)
Albumin: 3.6 g/dL (ref 3.5–5.0)
Anion gap: 6 (ref 5–15)
BILIRUBIN TOTAL: 0.5 mg/dL (ref 0.3–1.2)
BUN: 12 mg/dL (ref 6–20)
CHLORIDE: 105 mmol/L (ref 101–111)
CO2: 29 mmol/L (ref 22–32)
CREATININE: 0.95 mg/dL (ref 0.44–1.00)
Calcium: 8.9 mg/dL (ref 8.9–10.3)
GFR calc Af Amer: >60 mL/min (ref >60)
GFR, EST NON AFRICAN AMERICAN: 53 mL/min — AB (ref >60)
Glucose, Bld: 136 mg/dL — ABNORMAL HIGH (ref 65–99)
POTASSIUM: 3.7 mmol/L (ref 3.5–5.1)
Sodium: 140 mmol/L (ref 135–145)
Total Protein: 7.3 g/dL (ref 6.5–8.1)

## 2015-05-26 LAB — ETHANOL: Alcohol, Ethyl (B): <5 mg/dL (ref <5)

## 2015-05-26 NOTE — Progress Notes (Signed)
CSW met with patient at bedside. Daughter was present.   Daughter expressed that she is concerned that once the pt arrives at home she will not go inside the house. CSW spoke with daughter about assisted living facilities and also gave her a list of facilities.  Daughter informed CSW that she is interested in pursing guardianship for patient. She states that she spoke with a representative today about it and she plans to go through with the process. However, she is not the patient's legal guardian at this time.  CSW informed pt that she will be discharged home with daughter. Patient expressed her concerns regarding going back to live with daughter. Patient is not happy that her son also lives in the house with them and has welcomed his girlfriend to stay.  CSW provided supportive counseling for daughter and patient. Patient is agreeable to return home with daughter. Patient and daughter state that they do not have any questions at this time.   [name] [phone] ED CSW 05/26/2015 9:15 PM

## 2015-05-26 NOTE — [facility] Suicide Risk Assessment (Signed)
Suicide Risk Assessment  Discharge Assessment   [facility]   Demographic Factors:  Age 81 or older and Divorced or widowed  Total Time spent with patient: 30 minutes  Musculoskeletal: Strength & Muscle Tone: within normal limits Gait & Station: normal Patient leans: N/A  Psychiatric Specialty Exam:     Blood pressure 157/56, pulse 58, temperature 97.5 F (36.4 C), temperature source Oral, resp. rate 16, SpO2 98 %.There is no weight on file to calculate BMI.  General Appearance: Casual  Eye Contact::  Good  Speech:  Normal Rate409  Volume:  Normal  Mood:  Euthymic  Affect:  Congruent  Thought Process:  Forgetful at times, memory fair to poor at times  Orientation:  Full (Time, Place, and Person)  Thought Content:  WDL  Suicidal Thoughts:  No  Homicidal Thoughts:  No  Memory:  Immediate;   Fair Recent;   Poor Remote;   Fair  Judgement:  Fair  Insight:  Fair  Psychomotor Activity:  Normal  Concentration:  Good  Recall:  Fair  Fund of Knowledge:Good  Language: Good  Akathisia:  No  Handed:  Right  AIMS (if indicated):     Assets:  Housing Leisure Time Resilience Social Support  Sleep:     Cognition: Impaired,  Mild  ADL's:  Intact      Has this patient used any form of tobacco in the last 30 days? (Cigarettes, Smokeless Tobacco, Cigars, and/or Pipes) No  Mental Status Per Nursing Assessment::   On Admission:   Agitation at home  Current Mental Status by Physician: NA  Loss Factors: NA  Historical Factors: NA  Risk Reduction Factors:   Sense of responsibility to family, Living with another person, especially a relative and Positive social support  Continued Clinical Symptoms:  None  Cognitive Features That Contribute To Risk:  None    Suicide Risk:  Minimal: No identifiable suicidal ideation.  Patients presenting with no risk factors but with morbid ruminations; may be classified as minimal risk based on the severity  of the depressive symptoms  Principal Problem: Dementia with behavioral changes Discharge Diagnoses: Dementia with behavioral changes   Plan Of Care/Follow-up recommendations:  Activity:  as tolerated  Diet:  heart healthy diet  Is patient on multiple antipsychotic therapies at discharge:  No   Has Patient had three or more failed trials of antipsychotic monotherapy by history:  No  Recommended Plan for Multiple Antipsychotic Therapies: NA    Raylin Diguglielmo, PMH-NP 05/26/2015, 2:38 PM

## 2015-05-26 NOTE — ED Notes (Signed)
Bed: [id number] Expected date:  Expected time:  Means of arrival:  Comments: ROOM 4

## 2015-05-26 NOTE — BH Assessment (Addendum)
Tele Assessment Note   Michele Cervantes is an African-American, widowed, 81 y.o. female presenting to [facility] accompanied by [employer]. She has a hx of Alzheimer's. Pt's daughter took out IVC papers on pt due to suicidal threats to lay in the road and allow a car to run over her. Pt presents with depressed mood, labile affect, and good eye-contact. She cries intermittently throughout the assessment when speaking about her son and daughter "doing things that aren't right, like not going to church". Pt alternates between irritability, crying, and making jokes. She is dressed in a nightgown and layered with blankets around her. Pt rambles and talks excessively and makes numerous [non-offensive] racial comments in addition to being hyper-religious. Thought pattern and speech are tangential and pt is only oriented to person and place. Pt's insight into her dementia and behaviors is poor and her impulse control is fair, as she admits to getting angry easily and having outbursts. Pt states that she has never harmed anyone physically but her daughter reports that she became upset with her son recently and scratched him on the chest. Per daughter, the pt has also made homicidal threats to family members in the home. Tonight was reportedly the first time the pt has made a suicidal threat. However, pt denies SI/HI, plan, or intention and is adamant that she wants to live; pt c/o never being able to leave the house because her children reportedly will not drive or take her anywhere. She says that she is not doing anything during the day that brings any joy, which has led to depression. Pt endorses frequent crying spells, anhedonia, irritability and anger outbursts, insomnia, excessive worrying, decreased appetite, guilt, and "feeling unhappy". Pt wants to live independently and says she can complete all of her ADL's. However, pt reportedly wanders and gets lost. She has been brought back home by [employer] on several occassions. Pt  ruminates on some of her [adult] children's poor choices and repeatedly states how they should be reading the Bible and going to church. When the counselor asked the pt if she was blaming herself for her children's bad decisions, she responded "How did you know that?". Pt also excessively worries about tragedies and secularization happening in [place], stating that "[place] is going down the drain" and that "God does not like what is going on in [place]" Pt was d/c from [facility] on 05/05[facility]15 with resources and home healthcare was started.  Disposition: Per Hulan Fess, NP, Pt meets inpatient criteria. TTS to seek geri-psych placement.   Axis I: 331.0 Alzheimer's disease, With behavioral disturbance           309.28 Adjustment disorder, With mixed anxiety and depressed mood  Axis II: Deferred Axis III:  Past Medical History  Diagnosis Date  . Alzheimer disease   . Anginal pain   . Hypercholesteremia    Axis IV: housing problems, other psychosocial or environmental problems and problems with primary support group Axis V: 31-40 impairment in reality testing  Past Medical History:  Past Medical History  Diagnosis Date  . Alzheimer disease   . Anginal pain   . Hypercholesteremia     Past Surgical History  Procedure Laterality Date  . Abdominal hysterectomy      Family History: No family history on file.  Social History:  reports that she has never smoked. She does not have any smokeless tobacco history on file. She reports that she does not drink alcohol or use illicit drugs.  Additional Social History:  Alcohol /  Drug Use Pain Medications: SEE MAR Prescriptions: SEE MAR Over the Counter: SEE MAR History of alcohol / drug use?: No history of alcohol / drug abuse  CIWA: CIWA-Ar BP: 183/63 mmHg Pulse Rate: 98 COWS:    PATIENT STRENGTHS: (choose at least two) Average or above average intelligence Psychologist, counselling means Religious Affiliation  Allergies: No Known  Allergies  Home Medications:  (Not in a hospital admission)  OB/GYN Status:  No LMP recorded. Patient has had a hysterectomy.  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) Is patient pregnant?: No Pregnancy Status: No Living Arrangements: Children Can pt return to current living arrangement?: Yes (But pt does NOT want to return to current living situation) Admission Status: Involuntary Is patient capable of signing voluntary admission?: No Referral Source: Self/Family/Friend Insurance type: Medicare     Crisis Care Plan Living Arrangements: Children Name of Psychiatrist: None Name of Therapist: None  Education Status Is patient currently in school?: No Current Grade: na Highest grade of school patient has completed: Some college Name of school: na Contact person: na  Risk to self with the past 6 months Suicidal Ideation: No-Not Currently/Within Last 6 Months Has patient been a risk to self within the past 6 months prior to admission? : No Suicidal Intent: No Has patient had any suicidal intent within the past 6 months prior to admission? : No Is patient at risk for suicide?: No Suicidal Plan?: No-Not Currently/Within Last 6 Months Has patient had any suicidal plan within the past 6 months prior to admission? : Yes (Pt reportedly made threat to lay in road) Access to Means: Yes Specify Access to Suicidal Means: Nearby roads What has been your use of drugs/alcohol within the last 12 months?: None Previous Attempts/Gestures: No How many times?: 0 Other Self Harm Risks: Pt wanders from home Triggers for Past Attempts: Family contact (Suicidal threat was due to family conflict) Intentional Self Injurious Behavior: None Family Suicide History:  No Recent stressful life event(s): Conflict (Comment), Loss (Comment) (Conflict with daughter & son, loss of mother in 73) Persecutory voices/beliefs?: No Depression: Yes Depression Symptoms: Insomnia, Tearfulness, Guilt, Loss of interest in usual pleasures, Feeling angry/irritable Substance abuse history and/or treatment for substance abuse?: No Suicide prevention information given to non-admitted patients: Not applicable  Risk to Others within the past 6 months Homicidal Ideation: No-Not Currently/Within Last 6 Months (Per daughter, pt has made homicidal threats to family before) Does patient have any lifetime risk of violence toward others beyond the six months prior to admission? : Yes (comment) (Recently became angry w/son and scratched him on the chest) Thoughts of Harm to Others: No-Not Currently Present/Within Last 6 Months (Threats to harm family members, no plan or intent) Current Homicidal Intent: No Current Homicidal Plan: No Access to Homicidal Means: No Identified Victim: Son, Daughter, Son's friend (all live w/pt) History of harm to others?: Yes (Scratched son recently and left marks) Assessment of Violence: None Noted Violent Behavior Description: Pt calm and cooperative during assessment; Pt admits to having anger outbursts at home frequently Does patient have access to weapons?: No Criminal Charges Pending?: No Does patient have a court date: No Is patient on probation?: No  Psychosis Hallucinations: None noted Delusions: None noted  Mental Status Report Appearance/Hygiene: Layered clothes (Clothing and layered in blankets) Eye  Contact: Good Motor Activity: Restlessness Speech: Tangential Level of Consciousness: Crying Mood: Labile Affect: Depressed, Labile Anxiety Level: Minimal Thought Processes: Tangential Judgement: Impaired Orientation: Person, Place Obsessive Compulsive Thoughts/Behaviors: None (But pt does ruminate on (adult) children's poor  choices)  Cognitive Functioning Concentration: Decreased Memory: Recent Intact IQ: Average Insight: Poor Impulse Control: Fair Appetite: Poor Weight Loss:  (UTA but daughter says pt refuses to eat some days) Weight Gain: 0 Sleep: Decreased Total Hours of Sleep: 4 (Pt sometimes wanders at night) Vegetative Symptoms: Staying in bed  ADLScreening ([facility] Assessment Services) [name] cognitive [facility] adequate to safely complete daily activities?: Yes Patient able to express need for assistance with ADLs?: Yes Independently performs ADLs?: Yes (appropriate for developmental age)  Prior Inpatient Therapy Prior Inpatient Therapy: No Prior Therapy Dates: na Prior Therapy Facilty/Provider(s): na Reason for Treatment: na  Prior Outpatient Therapy Prior Outpatient Therapy: No Prior Therapy Dates: na Prior Therapy Facilty/Provider(s): na Reason for Treatment: na Does patient have an ACCT team?: No Does patient have Intensive In-House Services?  : No Does patient have [facility] services? : No Does patient have P4CC services?: No  ADL Screening (condition at time of admission) [name] cognitive ability adequate to safely complete daily activities?: Yes Is the patient deaf or have difficulty hearing?: No (Slightly hard of hearing) Does the patient have difficulty seeing, even when wearing glasses/contacts?: No Does the patient have difficulty concentrating, remembering, or making decisions?: Yes Patient able to express need for assistance with ADLs?: Yes Does the patient have difficulty dressing or bathing?: No Independently performs ADLs?: Yes (appropriate for developmental age) Does the patient have difficulty walking or climbing stairs?: No Weakness of Legs: None Weakness of Arms/Hands: None  Home Assistive Devices/Equipment Home Assistive Devices/Equipment: None    Abuse/Neglect Assessment (Assessment to be complete while patient is alone) Physical Abuse: Denies Verbal  Abuse: Denies Sexual Abuse: Denies Exploitation of patient/patient's resources: Denies Self-Neglect: Denies Values / Beliefs Cultural Requests During Hospitalization: None Spiritual Requests During Hospitalization: None   Advance Directives (For Healthcare) Does patient have an advance directive?: No Would patient like information on creating an advanced directive?: No - patient declined information    Additional Information 1:1 In Past 12 Months?: No CIRT Risk: No Elopement Risk: Yes (Due to pt's wandering) Does patient have medical clearance?: Yes     Disposition: Per Hulan FessIjeoma Nwaeze, NP, Pt meets inpatient criteria. TTS to seek geri-psych placement.  Disposition Initial Assessment Completed for this Encounter: Yes Disposition of Patient: Inpatient treatment program Type of inpatient treatment program: Adult (Geri-psych facility needed) Type of outpatient treatment:  (Also follow-up w/Social Work referrals) Other disposition(s): Referred to outside facility  Cyndie MullAnna Lucilia Yanni, [facility] Triage Specialist  05/26/2015 2:33 AM

## 2015-05-26 NOTE — ED Notes (Signed)
Pt updated on plan of care and daughter picking her up at 1730

## 2015-05-26 NOTE — Progress Notes (Signed)
Per psychiatrist and Np patient is psychiatrically stable for discharge home. CSw spoke with pt daughter, Carlyle BasquesSandra Williams [phone] (work) or [phone] (cell). CSW and pt daughter discussed assisted living and petitioning for guardianship due to patient flip flopping on needing assisted living and being willing to refusing any help. Patient daughter to meet with evening CSW after 530pm.   Olga CoasterKristen Alexsis Kathman, LCSW  Clinical Social Work  [facility] [phone]

## 2015-05-26 NOTE — ED Notes (Signed)
Pt's daughter Carlyle BasquesSandra Williams can be reached at [phone] (work) or [phone] (cell).

## 2015-05-26 NOTE — BHH Counselor (Signed)
TTS Counselor sent referral to the following facilities in effort to obtain geri-psych placement:  [facility]

## 2015-05-26 NOTE — Progress Notes (Signed)
Patient ID: Michele Cervantes, female   DOB: 02/23/1929, 81 y.o.   MRN: [id number] Psychiatric Specialty Exam: Physical Exam  ROS  Blood pressure 157/56, pulse 58, temperature 97.5 F (36.4 C), temperature source Oral, resp. rate 16, SpO2 98 %.There is no weight on file to calculate BMI.  General Appearance: Well Groomed  Patent attorneyye Contact::  Good  Speech:  clear  Volume:  Normal  Mood:  Euthymic  Affect:  Congruent  Thought Process:  generally coherent but poor recall   Orientation:  Other:  knows who she is  but not the day or date.  [place].  She does know it is summer.  Thought Content:  Negative  Suicidal Thoughts:  No  Homicidal Thoughts:  No  Memory:  Immediate;   Poor Recent;   Poor Remote;   Poor  Judgement:  Impaired  Insight:  Lacking  Psychomotor Activity:  sitting in a chair but reportedly moves well enough  Concentration:  Good  Recall:  Poor  Fund of Knowledge:  Poor  Language:  Good  Akathisia:  Negative  Handed:  Right  AIMS (if indicated):     Assets:  Social Support  ADL's:  Intact  Cognition:  impaired  Sleep:   says she sleeps okay   Ms Michele Cervantes is very pleasant and mannerly.  She only complains about one son , the eldest who is giving her trouble but does not say what he does.  She cannot remember things from day to day. She does not know dates and such but is clear that she does not want to hurt herself or anybody else.  The social worker called her and her daughter with whom she lives will come to take her home today.

## 2015-05-26 NOTE — Progress Notes (Signed)
[name] was sitting beside her bed in a chair when I arrived. She said she was fine and didn't want to get in the bed. She was given Bible which she requested. Referring to God she said He would take care of her; she made statements like, she is not holier than thou; God was going to take care of her; she said she gets angry sometimes - but I'm me; she said she is in the way (referring to living with her daughter). Her daughter Michele Davenport(Sandra) who was bedside during the visit stated she won't let her take care of her. [name] seemed to vacillate btwn doing good and God is with her knows her to the devil whom she said knows her too. There was no direct interaction between [name] and her daughter during our visit. Michele DavenportSandra alluded that it is difficult at times to take care of her. Chaplain Elmarie Shileyamela Carrington Holder   05/26/15 0700  Clinical Encounter Type  Visited With Patient and family together

## 2015-05-26 NOTE — Progress Notes (Signed)
Patient ID: Michele Cervantes, female   DOB: 03/31/1929, 81 y.o.   MRN: [id number] Review of Systems  Constitutional: Negative.   HENT: Negative.   Eyes: Negative.   Respiratory: Negative.   Cardiovascular: Negative.   Gastrointestinal: Negative.   Genitourinary: Negative.   Musculoskeletal: Negative.   Skin: Negative.   Neurological: Negative.   Endo/Heme/Allergies: Negative.   Psychiatric/Behavioral: Positive for memory loss.    Ms Michele Cervantes says nothing hurts her and she is not worried about anything regarding her health

## 2015-06-17 ENCOUNTER — Encounter: Payer: Self-pay | Admitting: Neurology

## 2015-06-17 ENCOUNTER — Ambulatory Visit (INDEPENDENT_AMBULATORY_CARE_PROVIDER_SITE_OTHER): Payer: Medicare PPO | Admitting: Neurology

## 2015-06-17 VITALS — BP 143/64 | HR 66 | Ht 61.0 in | Wt 153.6 lb

## 2015-06-17 DIAGNOSIS — F03918 Unspecified dementia, unspecified severity, with other behavioral disturbance: Secondary | ICD-10-CM

## 2015-06-17 DIAGNOSIS — E538 Deficiency of other specified B group vitamins: Secondary | ICD-10-CM | POA: Diagnosis not present

## 2015-06-17 DIAGNOSIS — F0391 Unspecified dementia with behavioral disturbance: Secondary | ICD-10-CM

## 2015-06-17 MED ORDER — SERTRALINE HCL 25 MG PO TABS: ORAL_TABLET | ORAL | Status: DC

## 2015-06-17 NOTE — Patient Instructions (Signed)
   As part of a workup for the memory problems, we will check a CT scan of the head, and we will do blood work today. We will start a medication called Zoloft (sertraline) for the depression, anxiety, and agitation. If there are issues with the medication, please contact our office. For now, we will continue the Aricept at the 5 mg dose and the hydroxyzine. We will follow-up in 3-4 months, sooner if needed. Please contact our office if any issues or problems arise.

## 2015-06-17 NOTE — Progress Notes (Signed)
Reason for visit: Memory disorder  Referring physician: Dr. Denyse Amass is a 81 y.o. female  History of present illness:  Ms. Scioli is an 81 year old right-handed black female with a history of a progressive memory disorder that has been present for least 4 years. The patient is living with her daughter, she has been there for least 2 years. The patient has not operated a motor vehicle in greater than one year. She has had ongoing progression of memory issues, but this has also been associated with episodes of agitation over the last 2 years. Aricept was started in low dose, 5 mg, one year ago, and does not correlate with the onset of the agitation. She has had multiple emergency room visits secondary to agitation, the last visit was on 05/26/2015. The patient at that time indicated that she did not wish to live, she was going to walk out into the road and get hit by car. The patient was placed on hydroxyzine which has had some benefit with her behavior. The patient comes to this office for further evaluation. She has not had a CT scan or MRI of the brain, and the family is unaware of any other blood work that was done to evaluate the memory issues. The patient reports some occasional headache when she gets upset, she denies any weakness or numbness of the extremities, the daughter reports some mild gait instability at times. The patient indicates that she is sleeping well, and she is eating well. The patient does not keep up with her medications or appointments, she does not cook, and she does not do her finances. The patient does admit to having problems with agitation and with depression.  Past Medical History  Diagnosis Date  . Alzheimer disease   . Anginal pain   . Hypercholesteremia   . Hypertension   . CHF (congestive heart failure)     Past Surgical History  Procedure Laterality Date  . Abdominal hysterectomy    . Thyroid lobectomy      Family History    Problem Relation Age of Onset  . CAD Mother   . Heart disease Mother   . Breast cancer Mother   . Dementia Mother   . Alcohol abuse Father   . Cancer Brother   . Dementia Brother     Social history:  reports that she has never smoked. She has never used smokeless tobacco. She reports that she does not drink alcohol or use illicit drugs.  Medications:  Prior to Admission medications   Medication Sig Start Date End Date Taking? Authorizing Provider  aspirin 81 MG tablet Take 81 mg by mouth daily.   Yes Historical Provider, MD  donepezil (ARICEPT) 5 MG tablet Take 5 mg by mouth daily. 04/19/15  Yes Historical Provider, MD  hydrOXYzine (ATARAX/VISTARIL) 10 MG tablet Take 10 mg by mouth 3 (three) times daily as needed.   Yes Historical Provider, MD  pravastatin (PRAVACHOL) 10 MG tablet Take 10 mg by mouth daily. 02/21/15  Yes Historical Provider, MD  zolpidem (AMBIEN) 10 MG tablet Take 5-10 mg by mouth at bedtime as needed for sleep.  04/27/15  Yes Historical Provider, MD     No Known Allergies  ROS:  Out of a complete 14 system review of symptoms, the patient complains only of the following symptoms, and all other reviewed systems are negative.  Chest pain, swelling in the legs Hearing loss Moles Cough Cramps Memory loss, confusion, headache, numbness Insomnia  Blood pressure 143/64, pulse 66, height 5\' 1"  (1.549 m), weight 153 lb 9.6 oz (69.673 kg).  Physical Exam  General: The patient is alert and cooperative at the time of the examination.  Eyes: Pupils are equal, round, and reactive to light. Discs are flat bilaterally.  Neck: The neck is supple, no carotid bruits are noted.  Respiratory: The respiratory examination is clear.  Cardiovascular: The cardiovascular examination reveals a regular rate and rhythm, no obvious murmurs or rubs are noted.  Skin: Extremities are with 2+ edema below the knees bilaterally.  Neurologic Exam  Mental status: The patient is alert and  oriented x 2 at the time of the examination (not oriented to date). The Mini-Mental Status Examination done today shows a total score of 17/30. The patient is able to name 9 animals in 30 seconds.  Cranial nerves: Facial symmetry is present. There is good sensation of the face to pinprick and soft touch bilaterally. The strength of the facial muscles and the muscles to head turning and shoulder shrug are normal bilaterally. Speech is well enunciated, no aphasia or dysarthria is noted. Extraocular movements are full. Visual fields are full. The tongue is midline, and the patient has symmetric elevation of the soft palate. No obvious hearing deficits are noted.  Motor: The motor testing reveals 5 over 5 strength of all 4 extremities. Good symmetric motor tone is noted throughout.  Sensory: Sensory testing is intact to pinprick, soft touch, vibration sensation, and position sense on all 4 extremities. No evidence of extinction is noted.  Coordination: Cerebellar testing reveals good finger-nose-finger and heel-to-shin bilaterally.  Gait and station: Gait is normal. Tandem gait is normal. Romberg is negative. No drift is seen.  Reflexes: Deep tendon reflexes are symmetric and normal bilaterally. Toes are downgoing bilaterally.   Assessment/Plan:  1. Progressive dementia  2. Depression, anxiety, agitation  The patient will be set up for further evaluation at this time, she will have a CT scan of the brain, blood work to be done today. The patient will be placed on low-dose Zoloft, starting on a dose of 25 mg daily for one week, then go to 50 mg daily. The dose can be adjusted before her next revisit if needed. At some point, I would like to discontinue the hydroxyzine, and increase the Aricept to a 10 mg maintenance dose. The patient will follow-up in about 3-4 months. I would agree that the patient should not operate a motor vehicle.  Marlan Palau MD 06/18/2015 5:37 PM  [facility] [address] [place], [place] [address]  Phone [phone] Fax [phone]

## 2015-06-19 LAB — RPR: RPR Ser Ql: NONREACTIVE

## 2015-06-19 LAB — COPPER, SERUM: COPPER: 114 ug/dL (ref 72–166)

## 2015-06-19 LAB — METHYLMALONIC ACID, SERUM: Methylmalonic Acid: 145 nmol/L (ref 0–378)

## 2015-06-19 LAB — VITAMIN B12: Vitamin B-12: 904 pg/mL (ref 211–946)

## 2015-06-22 ENCOUNTER — Telehealth: Payer: Self-pay

## 2015-06-22 NOTE — Telephone Encounter (Signed)
-----   Message from York Spaniel, MD sent at 06/19/2015  4:04 PM EDT -----  The blood work results are unremarkable. Please call the patient.  ----- Message -----    From: [facility] Lab Results In Interface    Sent: 06/18/2015   7:43 AM      To: York Spaniel, MD

## 2015-06-22 NOTE — Telephone Encounter (Signed)
I spoke to the patient's daughter, Dois Davenport, and relayed results.

## 2015-06-23 ENCOUNTER — Ambulatory Visit
Admission: RE | Admit: 2015-06-23 | Discharge: 2015-06-23 | Disposition: A | Discharge status: Home / Self Care | Payer: Medicare PPO | Source: Ambulatory Visit | Attending: Neurology | Admitting: Neurology

## 2015-06-23 ENCOUNTER — Telehealth: Payer: Self-pay | Admitting: Neurology

## 2015-06-23 DIAGNOSIS — F0391 Unspecified dementia with behavioral disturbance: Secondary | ICD-10-CM | POA: Diagnosis not present

## 2015-06-23 DIAGNOSIS — F03918 Unspecified dementia, unspecified severity, with other behavioral disturbance: Secondary | ICD-10-CM

## 2015-06-23 NOTE — Telephone Encounter (Signed)
I called the family. CT the head shows no significant small vessel ischemic changes, diffuse atrophy is seen.   CT head 06/23/2015:  IMPRESSION:  Abnormal CT head (without) demonstrating: 1. Mild diffuse and severe mesial temporal atrophy. Moderate ventriculomegaly on ex vacuo basis. 2. No acute findings.

## 2015-07-06 ENCOUNTER — Emergency Department (HOSPITAL_BASED_OUTPATIENT_CLINIC_OR_DEPARTMENT_OTHER)
Admission: EM | Admit: 2015-07-06 | Discharge: 2015-07-06 | Disposition: A | Discharge status: Home / Self Care | Payer: Medicare PPO | Attending: Emergency Medicine | Admitting: Emergency Medicine

## 2015-07-06 ENCOUNTER — Emergency Department (HOSPITAL_BASED_OUTPATIENT_CLINIC_OR_DEPARTMENT_OTHER): Payer: Medicare PPO

## 2015-07-06 ENCOUNTER — Telehealth: Payer: Self-pay | Admitting: Neurology

## 2015-07-06 ENCOUNTER — Encounter: Payer: Self-pay | Admitting: Neurology

## 2015-07-06 ENCOUNTER — Encounter (HOSPITAL_BASED_OUTPATIENT_CLINIC_OR_DEPARTMENT_OTHER): Payer: Self-pay | Admitting: *Deleted

## 2015-07-06 DIAGNOSIS — E780 Pure hypercholesterolemia: Secondary | ICD-10-CM | POA: Diagnosis not present

## 2015-07-06 DIAGNOSIS — R05 Cough: Secondary | ICD-10-CM | POA: Diagnosis not present

## 2015-07-06 DIAGNOSIS — I209 Angina pectoris, unspecified: Secondary | ICD-10-CM | POA: Diagnosis not present

## 2015-07-06 DIAGNOSIS — Z7982 Long term (current) use of aspirin: Secondary | ICD-10-CM | POA: Diagnosis not present

## 2015-07-06 DIAGNOSIS — Z79899 Other long term (current) drug therapy: Secondary | ICD-10-CM | POA: Diagnosis not present

## 2015-07-06 DIAGNOSIS — I509 Heart failure, unspecified: Secondary | ICD-10-CM | POA: Insufficient documentation

## 2015-07-06 DIAGNOSIS — I10 Essential (primary) hypertension: Secondary | ICD-10-CM | POA: Insufficient documentation

## 2015-07-06 DIAGNOSIS — F0390 Unspecified dementia without behavioral disturbance: Secondary | ICD-10-CM | POA: Diagnosis present

## 2015-07-06 DIAGNOSIS — F03918 Unspecified dementia, unspecified severity, with other behavioral disturbance: Secondary | ICD-10-CM

## 2015-07-06 DIAGNOSIS — F0391 Unspecified dementia with behavioral disturbance: Secondary | ICD-10-CM | POA: Insufficient documentation

## 2015-07-06 LAB — URINALYSIS, ROUTINE W REFLEX MICROSCOPIC
Bilirubin Urine: NEGATIVE
Glucose, UA: NEGATIVE mg/dL
Hgb urine dipstick: NEGATIVE
KETONES UR: NEGATIVE mg/dL
LEUKOCYTES UA: NEGATIVE
NITRITE: NEGATIVE
PH: 6.5 (ref 5.0–8.0)
Protein, ur: NEGATIVE mg/dL
SPECIFIC GRAVITY, URINE: 1.023 (ref 1.005–1.030)
Urobilinogen, UA: 1 mg/dL (ref 0.0–1.0)

## 2015-07-06 LAB — CBC WITH DIFFERENTIAL/PLATELET
BASOS ABS: 0 10*3/uL (ref 0.0–0.1)
Basophils Relative: 0 % (ref 0–1)
Eosinophils Absolute: 0.2 10*3/uL (ref 0.0–0.7)
Eosinophils Relative: 3 % (ref 0–5)
HEMATOCRIT: 42 % (ref 36.0–46.0)
Hemoglobin: 13.5 g/dL (ref 12.0–15.0)
LYMPHS PCT: 34 % (ref 12–46)
Lymphs Abs: 2.1 10*3/uL (ref 0.7–4.0)
MCH: 28.7 pg (ref 26.0–34.0)
MCHC: 32.1 g/dL (ref 30.0–36.0)
MCV: 89.2 fL (ref 78.0–100.0)
MONO ABS: 0.6 10*3/uL (ref 0.1–1.0)
Monocytes Relative: 9 % (ref 3–12)
NEUTROS ABS: 3.4 10*3/uL (ref 1.7–7.7)
Neutrophils Relative %: 54 % (ref 43–77)
Platelets: 330 10*3/uL (ref 150–400)
RBC: 4.71 MIL/uL (ref 3.87–5.11)
RDW: 13.7 % (ref 11.5–15.5)
WBC: 6.3 10*3/uL (ref 4.0–10.5)

## 2015-07-06 LAB — BASIC METABOLIC PANEL
ANION GAP: 8 (ref 5–15)
BUN: 13 mg/dL (ref 6–20)
CHLORIDE: 101 mmol/L (ref 101–111)
CO2: 30 mmol/L (ref 22–32)
Calcium: 9.1 mg/dL (ref 8.9–10.3)
Creatinine, Ser: 0.83 mg/dL (ref 0.44–1.00)
GFR calc Af Amer: >60 mL/min (ref >60)
GFR calc non Af Amer: >60 mL/min (ref >60)
GLUCOSE: 93 mg/dL (ref 65–99)
POTASSIUM: 4.1 mmol/L (ref 3.5–5.1)
Sodium: 139 mmol/L (ref 135–145)
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
## 2015-07-06 NOTE — Telephone Encounter (Signed)
I called the daughter. The patient will require guardianship for her mother, the patient is testing in the moderate range of dementia. I will dictate a letter. She will pick up a letter tomorrow.

## 2015-07-06 NOTE — Telephone Encounter (Signed)
Daughter [name] called, states she spoke with a lawyer at [employer] and they advised her that she does not need a POA but instead needs Guardianship. They need something from Dr. Anne Hahn stating that patient is unable to make health decisions and is unable to handle her personal affairs/business.
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
## 2015-07-06 NOTE — ED Notes (Signed)
Patient transported to X-ray 

## 2015-07-06 NOTE — Discharge Instructions (Signed)

## 2015-07-18 ENCOUNTER — Emergency Department (HOSPITAL_COMMUNITY)
Admission: EM | Admit: 2015-07-18 | Discharge: 2015-07-19 | Disposition: A | Discharge status: Home / Self Care | Payer: Medicare PPO | Attending: Emergency Medicine | Admitting: Emergency Medicine

## 2015-07-18 ENCOUNTER — Encounter (HOSPITAL_COMMUNITY): Payer: Self-pay | Admitting: Emergency Medicine

## 2015-07-18 DIAGNOSIS — I10 Essential (primary) hypertension: Secondary | ICD-10-CM | POA: Diagnosis not present

## 2015-07-18 DIAGNOSIS — F0281 Dementia in other diseases classified elsewhere with behavioral disturbance: Secondary | ICD-10-CM | POA: Insufficient documentation

## 2015-07-18 DIAGNOSIS — E780 Pure hypercholesterolemia: Secondary | ICD-10-CM | POA: Diagnosis not present

## 2015-07-18 DIAGNOSIS — F03918 Unspecified dementia, unspecified severity, with other behavioral disturbance: Secondary | ICD-10-CM | POA: Diagnosis present

## 2015-07-18 DIAGNOSIS — Z7982 Long term (current) use of aspirin: Secondary | ICD-10-CM | POA: Diagnosis not present

## 2015-07-18 DIAGNOSIS — G309 Alzheimer's disease, unspecified: Secondary | ICD-10-CM | POA: Insufficient documentation

## 2015-07-18 DIAGNOSIS — F0391 Unspecified dementia with behavioral disturbance: Secondary | ICD-10-CM | POA: Diagnosis not present

## 2015-07-18 DIAGNOSIS — I509 Heart failure, unspecified: Secondary | ICD-10-CM | POA: Insufficient documentation

## 2015-07-18 DIAGNOSIS — Z79899 Other long term (current) drug therapy: Secondary | ICD-10-CM | POA: Insufficient documentation

## 2015-07-18 DIAGNOSIS — R443 Hallucinations, unspecified: Secondary | ICD-10-CM | POA: Diagnosis present

## 2015-07-18 LAB — COMPREHENSIVE METABOLIC PANEL
ALK PHOS: 95 U/L (ref 38–126)
ALT: 13 U/L — ABNORMAL LOW (ref 14–54)
ANION GAP: 6 (ref 5–15)
AST: 19 U/L (ref 15–41)
Albumin: 3.9 g/dL (ref 3.5–5.0)
BUN: 13 mg/dL (ref 6–20)
CALCIUM: 9.3 mg/dL (ref 8.9–10.3)
CO2: 29 mmol/L (ref 22–32)
Chloride: 104 mmol/L (ref 101–111)
Creatinine, Ser: 0.79 mg/dL (ref 0.44–1.00)
GFR calc non Af Amer: >60 mL/min (ref >60)
Glucose, Bld: 105 mg/dL — ABNORMAL HIGH (ref 65–99)
Potassium: 3.6 mmol/L (ref 3.5–5.1)
SODIUM: 139 mmol/L (ref 135–145)
TOTAL PROTEIN: 7.7 g/dL (ref 6.5–8.1)
Total Bilirubin: 0.5 mg/dL (ref 0.3–1.2)

## 2015-07-18 LAB — CBC
HCT: 40.5 % (ref 36.0–46.0)
HEMOGLOBIN: 13.4 g/dL (ref 12.0–15.0)
MCH: 29.8 pg (ref 26.0–34.0)
MCHC: 33.1 g/dL (ref 30.0–36.0)
MCV: 90.2 fL (ref 78.0–100.0)
PLATELETS: 340 10*3/uL (ref 150–400)
RBC: 4.49 MIL/uL (ref 3.87–5.11)
RDW: 13.4 % (ref 11.5–15.5)
WBC: 7.3 10*3/uL (ref 4.0–10.5)

## 2015-07-18 LAB — RAPID URINE DRUG SCREEN, HOSP PERFORMED
Amphetamines: NOT DETECTED
Barbiturates: NOT DETECTED
Benzodiazepines: NOT DETECTED
COCAINE: NOT DETECTED
OPIATES: NOT DETECTED
Tetrahydrocannabinol: NOT DETECTED

## 2015-07-18 LAB — ETHANOL: Alcohol, Ethyl (B): <5 mg/dL (ref <5)

## 2015-07-18 LAB — ACETAMINOPHEN LEVEL

## 2015-07-18 LAB — SALICYLATE LEVEL

## 2015-07-18 MED ORDER — PRAVASTATIN SODIUM 10 MG PO TABS
10.0000 mg | ORAL_TABLET | Freq: Every day | ORAL | Status: DC
  Filled 2015-07-18 (×2): qty 1

## 2015-07-18 MED ORDER — ONDANSETRON HCL 4 MG PO TABS: 4.0000 mg | ORAL_TABLET | Freq: Three times a day (TID) | ORAL | Status: DC | PRN

## 2015-07-18 NOTE — ED Notes (Signed)
Bed: WA29 Expected date:  Expected time:  Means of arrival:  Comments: Tr 4 

## 2015-07-18 NOTE — ED Notes (Signed)
Pt arrived in the custody of the [employer] with a complaint of hallucinations.  Pt has a history of dementia.  Pt has been seen for this previously and is on medication.  Daughter is the party that has called and is at the magistrates taking out IVC paperwork.  Pt states she went to her home town today and got sad over being back home.  According to the [facility] officer daughter has stated that patient has not been compliant with her medication.

## 2015-07-18 NOTE — ED Provider Notes (Signed)
CSN: [id number]     Arrival date & time 07/18/15  2105 History  This chart was scribed for non-physician practitioner working with Lavera Guise, MD by Angelene Giovanni, ED Scribe. The patient was seen in room WTR4/WLPT4 and the patient's care was started at 9:55 PM     Chief Complaint  Patient presents with  . Hallucinations   The history is provided by the patient. No language interpreter was used.   HPI Comments: Michele Cervantes is a 81 y.o. female who presents to the Emergency Department for hallucinations. She states that she wants to do "what she is supposed to do" like go to church but she does not have a ride. She states that she knows that her children do not like her because they do not like to be corrected. Pt is currently crying.   Past Medical History  Diagnosis Date  . Alzheimer disease   . Anginal pain   . Hypercholesteremia   . Hypertension   . CHF (congestive heart failure)    Past Surgical History  Procedure Laterality Date  . Abdominal hysterectomy    . Thyroid lobectomy     Family History  Problem Relation Age of Onset  . CAD Mother   . Heart disease Mother   . Breast cancer Mother   . Dementia Mother   . Alcohol abuse Father   . Cancer Brother   . Dementia Brother    Social History  Substance Use Topics  . Smoking status: Never Smoker   . Smokeless tobacco: Never Used  . Alcohol Use: No   OB History    No data available     Review of Systems  Constitutional: Negative for fever.  Respiratory: Negative for cough.   Cardiovascular: Negative for chest pain and leg swelling.  Musculoskeletal: Negative for myalgias.  Neurological: Negative for dizziness and headaches.  Psychiatric/Behavioral: Negative for suicidal ideas and hallucinations.  All other systems reviewed and are negative.     Allergies  Review of patient's allergies indicates no known allergies.  Home Medications   Prior to Admission medications   Medication Sig Start Date  End Date Taking? Authorizing Provider  ASA-APAP-Salicyl-Caff (PAIN RELIEF) TABS Take 1 tablet by mouth every 6 (six) hours as needed (pain).   Yes Historical Provider, MD  aspirin 81 MG tablet Take 81 mg by mouth daily.   Yes Historical Provider, MD  pravastatin (PRAVACHOL) 10 MG tablet Take 10 mg by mouth at bedtime.  02/21/15  Yes Historical Provider, MD  risperiDONE (RISPERDAL) 0.25 MG tablet Take 0.25 mg by mouth at bedtime.  07/14/15  Yes Historical Provider, MD  sertraline (ZOLOFT) 25 MG tablet One tablet daily for 1 week, then take 2 tablets daily Patient taking differently: Take 25 mg by mouth daily.  06/17/15  Yes York Spaniel, MD  traZODone (DESYREL) 50 MG tablet Take 50 mg by mouth at bedtime as needed for sleep.   Yes Historical Provider, MD   BP 164/65 mmHg  Pulse 93  Temp(Src) 98.2 F (36.8 C) (Oral)  Resp 20  SpO2 99% Physical Exam  Constitutional: She appears well-developed and well-nourished. No distress.  HENT:  Head: Normocephalic and atraumatic.  Eyes: Conjunctivae and EOM are normal.  Neck: Neck supple.  Cardiovascular: Normal rate.   Pulmonary/Chest: Effort normal. No respiratory distress.  Musculoskeletal: Normal range of motion.  Neurological: She is alert.  Skin: Skin is warm.  Psychiatric: Her speech is normal and behavior is normal. Thought content normal.  Her affect is labile. Cognition and memory are normal. She expresses impulsivity. She expresses no homicidal and no suicidal ideation. She expresses no suicidal plans and no homicidal plans.  Nursing note and vitals reviewed.   ED Course  Procedures (including critical care time) DIAGNOSTIC STUDIES: Oxygen Saturation is 99% on RA, normal by my interpretation.    COORDINATION OF CARE: 10:02 PM- Pt advised of plan for treatment and pt agrees.    Labs Review Labs Reviewed  COMPREHENSIVE METABOLIC PANEL  ETHANOL  SALICYLATE LEVEL  ACETAMINOPHEN LEVEL  CBC  URINE RAPID DRUG SCREEN, HOSP PERFORMED     Imaging Review No results found. I have personally reviewed and evaluated these images and lab results as part of my medical decision-making.   EKG Interpretation None     patient's labs have been reviewed.  She is now medically cleared for psych evaluation Patient will have social work evaluation in the morning MDM   Final diagnoses:  None    I personally performed the services described in this documentation, which was scribed in my presence. The recorded information has been reviewed and is accurate.  Earley Favor, NP 07/18/15 2220  Earley Favor, NP 07/19/15 1610  Lavera Guise, MD 07/19/15 1235

## 2015-07-19 DIAGNOSIS — F0391 Unspecified dementia with behavioral disturbance: Secondary | ICD-10-CM | POA: Diagnosis not present

## 2015-07-19 MED ORDER — TRAZODONE HCL 50 MG PO TABS: 50.0000 mg | ORAL_TABLET | Freq: Every evening | ORAL | Status: DC | PRN

## 2015-07-19 MED ORDER — ACETAMINOPHEN 325 MG PO TABS
650.0000 mg | ORAL_TABLET | Freq: Once | ORAL | Status: DC
  Filled 2015-07-19: qty 2

## 2015-07-19 NOTE — BHH Counselor (Addendum)
Disposition: Per Maryjean Morn, PA, pt does not meet any inpt tx criteria. Pt needs a social work consult to get some resources for family. Pt says she does not want to go back to live with her children.  Counselor made Earley Favor, NP aware of disposition. She states that pt has come to ED several times before and that the family always receives a SW consult but never follows up.  Daughter is [name] [phone]). TTS counselor informed daughter that pt would be staying tonight and will have a SW consult. Daughter went home for the night around 01:00 AM. She would like to be called by CSW and informed of plan and d/c.   Cyndie Mull, [facility] Therapeutic Triage
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
## 2015-07-19 NOTE — Progress Notes (Signed)
10:57am.  CSW spoke with pt's daughter, Mariabelen Pressly, per Sandra's request. Katharine Look asking questions about guardianship paperwork--she went to DSS earlier this week provided guardianship application. She states she is not able to fully complete paperwork because she does not know full financials for pt and pt will not grant access to her. CSW advised that this a common situation and inability to manage/secrecy and paranoia around finances is common reason families have to pursue guardianship. CSW advised for dtr to speak with DSS and submit current application filled out to best of her ability.   Dtr quite knowledgeable of senior resources and she has called and met with several agencies--appears to be waiting on applications to go through. [facility]. Dtr expressed overwhelm at coordinating care for mother. CSW provided supportive counseling. CSW referred pt to [facility]'s support group.  Per psych, pt clear for discharge. IVC rescinded. CSW to sign off.  [place] Worker [facility] phone: [phone]

## 2015-07-19 NOTE — ED Notes (Signed)
Pt has jewelry consisting of a ring with a black stone.  One gold colored ring.  A gold colored necklace with a picture of Barrack Obama.  Pt has a jacket, a blouse, a pair of pants, and shoes

## 2015-07-19 NOTE — Discharge Instructions (Signed)
Dementia °Dementia is a word that is used to describe problems with the brain and how it works. People with dementia have memory loss. They may also have problems with thinking, speaking, or solving problems. It can affect how they act around people, how they do their job, their mood, and their personality. These changes may not show up for a long time. Family or friends may not notice problems in the early part of this disease. °HOME CARE °The following tips are for the person living with, or caring for, the person with dementia. °Make the home safe. °· Remove locks on bathroom doors. °· Use childproof locks on cabinets where alcohol, cleaning supplies, or chemicals are stored. °· Put outlet covers in electrical outlets. °· Put in childproof locks to keep doors and windows safe. °· Remove stove knobs, or put in safety knobs that shut off on their own. °· Lower the temperature on water heaters. °· Label medicines. Lock them in a safe place. °· Keep knives, lighters, matches, power tools, and guns out of reach or in a safe place. °· Remove objects that might break or can hurt the person. °· Make sure lighting is good inside and outside. °· Put in grab bars if needed. °· Use a device that detects falls or other needs for help. °Lessen confusion. °· Keep familiar objects and people around. °· Use night lights or low lit (dim) lights at night. °· Label objects or areas. °· Use reminders, notes, or directions for daily activities or tasks. °· Keep a simple routine that is the same for waking, meals, bathing, dressing, and bedtime. °· Create a calm and quiet home. °· Put up clocks and calendars. °· Keep emergency numbers and the home address near all phones. °· Help show the different times of day. Open the curtains during the day to let light in. °Speak clearly and directly. °· Choose simple words and short sentences. °· Use a gentle, calm voice. °· Do not interrupt. °· If the person has a hard time finding a word to  use, give them the word or thought. °· Ask 1 question at a time. Give enough time for the person to answer. Repeat the question if the person does not answer. °Do things that lessen restlessness. °· Provide a comfortable bed. °· Have the same bedtime routine every night. °· Have a regular walking and activity schedule. °· Lessen naps during the day. °· Do not let the person drink a lot of caffeine. °· Go to events that are not overwhelming. °Eat well and drink fluids. °· Lessen distractions during meal times and snacks. °· Avoid foods that are too hot or too cold. °· Watch how the person chews and swallows. This is to make sure they do not choke. °Other °· Keep all vision, hearing, dental, and medical visits with the doctor. °· Only give medicines as told by the doctor. °· Watch the person's driving ability. Do not let the person drive if he or she cannot drive safely. °· Use a program that helps find a person if they become missing. You may need to register with this program. °GET HELP RIGHT AWAY IF:  °· A fever of 102° F (38.9° C) develops. °· Confusion develops or gets worse. °· Sleepiness develops or gets worse. °· Staying awake is hard to do. °· New behavior problems start like mood swings, aggression, and seeing things that are not there. °· Problems with balance, speech, or falling develop. °· Problems swallowing develop. °· Any   problems of another sickness develop. °MAKE SURE YOU: °· Understand these instructions. °· Will watch his or her condition. °· Will get help right away if he or she is not doing well or gets worse. °Document Released: 10/20/2008 Document Revised: 01/30/2012 Document Reviewed: 04/04/2011 °ExitCare® Patient Information ©2015 [employer]. This information is not intended to replace advice given to you by your health care provider. Make sure you discuss any questions you have with your health care provider. ° °

## 2015-07-19 NOTE — ED Notes (Signed)
Pt given [place] sandwich as she stated that she did not favor the food that was sent for breakfast. Pt ate only about 25% of her tray.
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
## 2015-10-12 ENCOUNTER — Emergency Department (HOSPITAL_COMMUNITY)
Admission: EM | Admit: 2015-10-12 | Discharge: 2015-10-12 | Disposition: A | Discharge status: Home / Self Care | Payer: Medicare PPO | Attending: Emergency Medicine | Admitting: Emergency Medicine

## 2015-10-12 ENCOUNTER — Encounter (HOSPITAL_COMMUNITY): Payer: Self-pay

## 2015-10-12 DIAGNOSIS — F0391 Unspecified dementia with behavioral disturbance: Secondary | ICD-10-CM

## 2015-10-12 DIAGNOSIS — G309 Alzheimer's disease, unspecified: Secondary | ICD-10-CM | POA: Diagnosis not present

## 2015-10-12 DIAGNOSIS — Z79899 Other long term (current) drug therapy: Secondary | ICD-10-CM | POA: Insufficient documentation

## 2015-10-12 DIAGNOSIS — R451 Restlessness and agitation: Secondary | ICD-10-CM | POA: Diagnosis present

## 2015-10-12 DIAGNOSIS — I10 Essential (primary) hypertension: Secondary | ICD-10-CM | POA: Insufficient documentation

## 2015-10-12 DIAGNOSIS — I509 Heart failure, unspecified: Secondary | ICD-10-CM | POA: Insufficient documentation

## 2015-10-12 DIAGNOSIS — R454 Irritability and anger: Secondary | ICD-10-CM | POA: Insufficient documentation

## 2015-10-12 DIAGNOSIS — F0281 Dementia in other diseases classified elsewhere with behavioral disturbance: Secondary | ICD-10-CM | POA: Diagnosis not present

## 2015-10-12 DIAGNOSIS — E7800 Pure hypercholesterolemia, unspecified: Secondary | ICD-10-CM | POA: Insufficient documentation

## 2015-10-12 DIAGNOSIS — Z7982 Long term (current) use of aspirin: Secondary | ICD-10-CM | POA: Insufficient documentation

## 2015-10-12 DIAGNOSIS — F03918 Unspecified dementia, unspecified severity, with other behavioral disturbance: Secondary | ICD-10-CM

## 2015-10-12 LAB — RAPID URINE DRUG SCREEN, HOSP PERFORMED
Amphetamines: NOT DETECTED
BARBITURATES: NOT DETECTED
Benzodiazepines: NOT DETECTED
COCAINE: NOT DETECTED
Opiates: NOT DETECTED
TETRAHYDROCANNABINOL: NOT DETECTED

## 2015-10-12 LAB — COMPREHENSIVE METABOLIC PANEL
ALT: 12 U/L — AB (ref 14–54)
AST: 18 U/L (ref 15–41)
Albumin: 3.6 g/dL (ref 3.5–5.0)
Alkaline Phosphatase: 99 U/L (ref 38–126)
Anion gap: 5 (ref 5–15)
BILIRUBIN TOTAL: 0.3 mg/dL (ref 0.3–1.2)
BUN: 8 mg/dL (ref 6–20)
CALCIUM: 8.7 mg/dL — AB (ref 8.9–10.3)
CO2: 31 mmol/L (ref 22–32)
CREATININE: 0.72 mg/dL (ref 0.44–1.00)
Chloride: 102 mmol/L (ref 101–111)
GFR calc Af Amer: >60 mL/min (ref >60)
Glucose, Bld: 105 mg/dL — ABNORMAL HIGH (ref 65–99)
Potassium: 2.8 mmol/L — ABNORMAL LOW (ref 3.5–5.1)
Sodium: 138 mmol/L (ref 135–145)
TOTAL PROTEIN: 7.3 g/dL (ref 6.5–8.1)

## 2015-10-12 LAB — ETHANOL

## 2015-10-12 LAB — CBC WITH DIFFERENTIAL/PLATELET
BASOS ABS: 0 10*3/uL (ref 0.0–0.1)
Basophils Relative: 0 %
Eosinophils Absolute: 0.1 10*3/uL (ref 0.0–0.7)
Eosinophils Relative: 1 %
HEMATOCRIT: 37.5 % (ref 36.0–46.0)
Hemoglobin: 12.2 g/dL (ref 12.0–15.0)
LYMPHS ABS: 1.4 10*3/uL (ref 0.7–4.0)
LYMPHS PCT: 18 %
MCH: 28.8 pg (ref 26.0–34.0)
MCHC: 32.5 g/dL (ref 30.0–36.0)
MCV: 88.7 fL (ref 78.0–100.0)
MONO ABS: 0.8 10*3/uL (ref 0.1–1.0)
Monocytes Relative: 10 %
NEUTROS ABS: 5.5 10*3/uL (ref 1.7–7.7)
Neutrophils Relative %: 71 %
Platelets: 288 10*3/uL (ref 150–400)
RBC: 4.23 MIL/uL (ref 3.87–5.11)
RDW: 13.4 % (ref 11.5–15.5)
WBC: 7.8 10*3/uL (ref 4.0–10.5)

## 2015-10-12 LAB — URINALYSIS, ROUTINE W REFLEX MICROSCOPIC
BILIRUBIN URINE: NEGATIVE
Glucose, UA: NEGATIVE mg/dL
HGB URINE DIPSTICK: NEGATIVE
KETONES UR: NEGATIVE mg/dL
Leukocytes, UA: NEGATIVE
Nitrite: NEGATIVE
PROTEIN: NEGATIVE mg/dL
SPECIFIC GRAVITY, URINE: 1.023 (ref 1.005–1.030)
pH: 6 (ref 5.0–8.0)

## 2015-10-12 LAB — SALICYLATE LEVEL: Salicylate Lvl: <4 mg/dL (ref 2.8–30.0)

## 2015-10-12 LAB — ACETAMINOPHEN LEVEL: Acetaminophen (Tylenol), Serum: <10 ug/mL — ABNORMAL LOW (ref 10–30)

## 2015-10-12 NOTE — ED Notes (Signed)
Bed: [id number] Expected date:  Expected time:  Means of arrival:  Comments: EMS 49F ?anxiety
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
## 2015-10-12 NOTE — Discharge Instructions (Signed)
Continue taking your medications as prescribed. Follow-up with your primary care provider in 3-4 days. Return to the emergency department if symptoms worsen or new onset of fever, abdominal pain, vomiting, urinary symptoms, change in mental status, suicidal ideations, homicidal ideations, hallucinations.   Emergency Department Resource Guide 1) Find a Doctor and Pay Out of Pocket Although you won't have to find out who is covered by your insurance plan, it is a good idea to ask around and get recommendations. You will then need to call the office and see if the doctor you have chosen will accept you as a new patient and what types of options they offer for patients who are self-pay. Some doctors offer discounts or will set up payment plans for their patients who do not have insurance, but you will need to ask so you aren't surprised when you get to your appointment.  2) Contact Your Local Health Department Not all health departments have doctors that can see patients for sick visits, but many do, so it is worth a call to see if yours does. If you don't know where your local health department is, you can check in your phone book. The CDC also has a tool to help you locate your state's health department, and many state websites also have listings of all of their local health departments.  3) Find a Walk-in Clinic If your illness is not likely to be very severe or complicated, you may want to try a walk in clinic. These are popping up all over the country in pharmacies, drugstores, and shopping centers. They're usually staffed by nurse practitioners or physician assistants that have been trained to treat common illnesses and complaints. They're usually fairly quick and inexpensive. However, if you have serious medical issues or chronic medical problems, these are probably not your best option.  No Primary Care Doctor: - Call Health Connect at  [phone] - they can help you locate a primary care doctor  that  accepts your insurance, provides certain services, etc. - Physician Referral Service- [phone]  Chronic Pain Problems: Organization         Address  Phone   Notes  [facility]  [phone] Patients need to be referred by their primary care doctor.   Medication Assistance: Organization         Address  Phone   Notes  [facility] Med[facility] [address] [place] [place], [place] [address] [phone] --Must be a resident of [facility] -- Must have NO insurance coverage whatsoever (no Medicaid/ Medicare, etc.) -- The pt. MUST have a primary care doctor that directs their care regularly and follows them in the community   MedAssist  [phone]   [employer]  [phone]    Agencies that provide inexpensive medical care: Organization         Address  Phone   Notes  [facility]  [phone]   [facility]    [phone]   [facility] [address] [place], [place] [address] [phone]   [facility] [address] [place]. [address], [place] [phone]   Planned Parenthood    [phone]   [facility]    [phone]   [facility]  [address], [place] Phone:  [phone], Fax:  [phone] Hours of Operation:  9 am - 6 pm, M-F.  Also accepts  Medicaid/Medicare and self-pay.  [facility]  [address], [place] Phone: [phone], Fax: [phone]. Hours of Operation:  8:30 am - 5:30 pm, M-F.  Also accepts Medicaid and self-pay.  [facility] [place] [address], [place] Phone: [phone]) [phone]   [facility] [address], [place], [place] [phone], Ext. [phone] Mondays & Thursdays: 7-9 AM.  First 15 patients are seen on a first come, first serve basis.    Medicaid-accepting [facility] Providers:  Organization          Address  Phone   Notes  [facility] [address], [place] [phone]) [phone] Also accepts self-pay patients.  [facility] [address], [place]  [phone]) [phone]   [facility] [address], [place] [phone]) [phone]   [facility] [address] [place] [address], [place] [phone]) [phone]   Renaye RakersVeita Bland [address], [place]   [phone] Only accepts [place] Access [place] patients after they have their name applied to their card.   Self-Pay (no insurance) in [facility] [place]:  Organization         Address  Phone   Notes  Sickle Cell Patients, [facility] [address], [place] [phone]) [phone]   [facility] [address], [place] [phone]) [phone]   [facility]  [address],  [phone]   [facility]/Dr. Osei-Bonsu  [address] [place] [address], [place] or [phone], [place] [phone] Phone number for both [place] and [place] locations is the same.  [facility] [address], [place] [phone]   [facility] [address] [place] [address], [place] or [address] [phone](336) [phone]) [phone]   [facility] [address], [place] [phone], phone; [phone]) [phone], fax Sees patients 1st and 3rd Saturday of every month.  Must not qualify for public or private insurance (i.e. Medicaid, Medicare, [place] Health Choice, Veterans' Benefits)  Household income should be no more than 200% of the poverty level The clinic cannot treat you if you are pregnant or think you are pregnant  Sexually transmitted diseases are not treated at the clinic.    Dental Care: Organization         Address  Phone  Notes  [facility] of [facility] [address], [place] [phone] Accepts children up to age 79 who are enrolled in [place]icaid or [place] Health Choice; pregnant women with a Medicaid card; and children who have applied for Medicaid or [place] Health Choice, but were declined, whose parents can pay a reduced fee at time of service.  [facility] of [facility] [place]  [address], [place] [phone] Accepts children up to age 79 who are enrolled in [place] or [place] Health Choice; pregnant women with a Medicaid card; and children who have applied for Medicaid or [place] Health Choice, but were declined, whose parents can pay a reduced fee at time of service.  [facility]  [address], [place] [phone] Patients are seen by appointment only. Walk-ins are not accepted. [facility] will see patients 79 years of age and older. Monday - Tuesday (8am-5pm) Most  Wednesdays (8:30-5pm) $30 per visit, cash only  [facility]  [address], [facility] [phone] Patients are seen by appointment only. Walk-ins are not accepted. [place] will see patients 60 years of age and older. One Wednesday Evening (Monthly: Volunteer Based).  $30 per visit, cash only  [place]  [phone] for adults; Children under age 31, call Graduate Pediatric Dentistry at [phone]. Children aged 8-14, please call [phone] to request a pediatric application.  Dental services are provided in all areas of dental care including fillings, crowns and bridges, complete and partial dentures, implants, gum treatment, root canals, and extractions. Preventive care is also provided. Treatment is provided to both adults and children. Patients are selected via a lottery and there is often a waiting list.   [facility] [address], [place]  [phone] [email or web address]   [facility] [address] [place], [place]  [phone], Ext. [phone] Second and Fourth Thursday of each month, opens at 6:30 AM; Clinic ends at 9 AM.  Patients are seen on a first-come first-served basis, and a limited number are seen during each clinic.   [facility]  [address] Hillard Danker [place], [place] [phone]   Eligibility Requirements You must have lived in [place], [place], or [place] counties for at least the last three months.   You cannot be eligible for state or federal sponsored [employer], including [employer], [place], or [employer].   You generally cannot be eligible for healthcare insurance through your employer.    How to apply: Eligibility screenings are held every Tuesday and Wednesday afternoon from 1:00 pm until 4:00 pm. You do not need an appointment for the interview!  [facility] [address], [place], [place]   [place]  [place]  [phone]    Behavioral Health Resources in the Community: Intensive Outpatient Programs Organization         Address  Phone  Notes  [place] [place]. [address] [place], [place] [phone]   [facility] [address], [place], [place]   ADS: [facility] & Drug S[facility] [address], [place], [place]   [place] [address]  [place], [place] or [phone]   Substance Abuse Resources Organization         Address  Phone  Notes  Alcohol and Drug Services  [phone]   [place]  [phone]   The [place]   Chinita Pester  [phone]   Residential & Outpatient Substance Abuse Program  [phone]   Psychological Services Organization         Address  Phone  Notes  [facility] [place]  [place]  [phone]    [place] [address], [place] or [phone]    Mobile Crisis Teams Organization         Address  Phone  Notes  [facility], Mobile Crisis Care Unit  [phone]   [facility]  [address] [place], [place]   [place] [address] [place] [phone]    Self-Help/Support Groups Organization         Address  Phone  Notes  [facility]ental Health Assoc. of [place] - variety of support groups  336- [phone] Call for more information  Narcotics Anonymous (NA), [facility] [address], [place]  2 meetings at this location   Statisticianesidential Treatment Programs Organization         Address  Phone  Notes  [facility] [address],    [place]  [phone]   [facility]  [address], [place] [phone], [place], [place] [phone]   [facility] [address], [place] [phone] Admissions: 8am-3pm M-F  [facility] [address]    [place], [place] [phone]   [facility] [address], [place], [place] [phone]   [facility] [address]  [place], [place] [phone]   [facility] - Intensive Outpatient [address], [place], [place] [phone]   [facility] ([facility] [address]  [place], [place] [phone] or [phone]   [facility] (RT[facility]) [name] [place], [place] [phone] Accepts Medicaid  [facility] [address]  [place] [phone] Substance Abuse/Addiction Treatment   [facility] Behavioral Health Resources Organization         Address  Phone  Notes  [facility]  [phone]) [phone]   Angie FavaJulie Brannon, PhD [address], [address] [place], [place]   [phone]) [phone] or [phone]) [phone]   [facility]    [address] [place], [place] [phone]) [phone]   [facility] [address], [place], [place] [phone]) [phone] Insurance/Medicaid/sponsorship through [facility]  [facility] Families [address]                                    [place], [place] [phone]) [phone] Therapy/tele-psych/case  [facility] [address].   [place], [place] [phone]) [phone]    Dr. Lolly MustacheArfeen  [phone]) [phone]   [facility] [place]  [employer] [facility] 1) [address],  2) [address], [place] 3)  [address], [place] [phone]) [phone]  [phone]) [phone]   [facility] Child Abuse Hotline [phone](336) [phone] or [phone](336) [phone] (After Hours)

## 2015-10-12 NOTE — ED Provider Notes (Signed)
CSN: [id number]     Arrival date & time 10/12/15  [id number] History   First MD Initiated Contact with Patient 10/12/15 [phone]     Chief Complaint  Patient presents with  . Agitation     (Consider location/radiation/quality/duration/timing/severity/associated sxs/prior Treatment) HPI Comments: Pt is a 81 yo female with PMH of alzheimer's who presents to the ED via EMS with complaint of agitation. Pt has been seen in our ED multiple times for dementia with behavioral problems. Pt reports she was at home tonight after meeting friends that were in town for a get together when she started to get upset at her son. She reports living at home with her son and daughter and states that her and her son "just dont get along". Pt states he acts like he is the parent and she is the child which makes her upset. She denies any complaints at this time. She reports to taking all her medications as prescribed. Denies SI/HI or visual/auditory hallucinations.    Past Medical History  Diagnosis Date  . Alzheimer disease   . Anginal pain (HCC)   . Hypercholesteremia   . Hypertension   . CHF (congestive heart failure) [facility](HCC)    Past Surgical History  Procedure Laterality Date  . Abdominal hysterectomy    . Thyroid lobectomy     Family History  Problem Relation Age of Onset  . CAD Mother   . Heart disease Mother   . Breast cancer Mother   . Dementia Mother   . Alcohol abuse Father   . Cancer Brother   . Dementia Brother    Social History  Substance Use Topics  . Smoking status: Never Smoker   . Smokeless tobacco: Never Used  . Alcohol Use: No   OB History    No data available     Review of Systems  Psychiatric/Behavioral: Positive for agitation.      Allergies  Review of patient's allergies indicates no known allergies.  Home Medications   Prior to Admission medications   Medication Sig Start Date End Date Taking? Authorizing Provider  aspirin 81 MG tablet Take 81 mg by mouth daily.   Yes  Historical Provider, MD  donepezil (ARICEPT) 10 MG tablet Take 10 mg by mouth at bedtime. 10/10/15  Yes Historical Provider, MD  pravastatin (PRAVACHOL) 10 MG tablet Take 10 mg by mouth at bedtime.  02/21/15  Yes Historical Provider, MD  risperiDONE (RISPERDAL) 0.25 MG tablet Take 0.25 mg by mouth at bedtime.  07/14/15  Yes Historical Provider, MD  sertraline (ZOLOFT) 25 MG tablet One tablet daily for 1 week, then take 2 tablets daily Patient taking differently: Take 50 mg by mouth daily.  06/17/15  Yes York Spanielharles K Willis, MD  traZODone (DESYREL) 50 MG tablet Take 50 mg by mouth at bedtime as needed for sleep.   Yes Historical Provider, MD   BP 165/70 mmHg  Pulse 65  Temp(Src) 97.8 F (36.6 C) (Oral)  Resp 22  SpO2 96% Physical Exam  Constitutional: She appears well-developed and well-nourished. No distress.  Pt laying in bed resting comfortably.   HENT:  Head: Normocephalic and atraumatic.  Mouth/Throat: Oropharynx is clear and moist. No oropharyngeal exudate.  Eyes: Conjunctivae and EOM are normal. Pupils are equal, round, and reactive to light. Right eye exhibits no discharge. Left eye exhibits no discharge. No scleral icterus.  Neck: Normal range of motion. Neck supple.  Cardiovascular: Normal rate, regular rhythm, normal heart sounds and intact distal pulses.   Pulmonary/Chest:  Effort normal and breath sounds normal. No respiratory distress. She has no wheezes. She has no rales. She exhibits no tenderness.  Abdominal: Soft. Bowel sounds are normal. She exhibits no distension and no mass. There is no tenderness. There is no rebound and no guarding.  Musculoskeletal: Normal range of motion. She exhibits no edema or tenderness.  Neurological: She is alert. She has normal strength. No cranial nerve deficit or sensory deficit. Coordination normal.  Pt is alert and oriented to self and place, however when asked what year/month it is she reports she is "too upset" to remember.   Skin: Skin is  warm and dry. She is not diaphoretic.  Psychiatric: Her speech is normal and behavior is normal. Thought content normal. Her affect is angry. Cognition and memory are impaired.  Nursing note and vitals reviewed.   ED Course  Procedures (including critical care time) Labs Review Labs Reviewed  COMPREHENSIVE METABOLIC PANEL - Abnormal; Notable for the following:    Potassium 2.8 (*)    Glucose, Bld 105 (*)    Calcium 8.7 (*)    ALT 12 (*)    All other components within normal limits  ACETAMINOPHEN LEVEL - Abnormal; Notable for the following:    Acetaminophen (Tylenol), Serum <10 (*)    All other components within normal limits  CBC WITH DIFFERENTIAL/PLATELET  URINALYSIS, ROUTINE W REFLEX MICROSCOPIC (NOT AT [facility])  ETHANOL  SALICYLATE LEVEL  URINE RAPID DRUG SCREEN, HOSP PERFORMED    Imaging Review No results found. I have personally reviewed and evaluated these images and lab results as part of my medical decision-making.  Filed Vitals:   10/12/15 0605 10/12/15 0747  BP: 171/59 165/70  Pulse: 85 65  Temp: 97.8 F (36.6 C)   Resp: 20 22     MDM   Final diagnoses:  Dementia with behavioral disturbance    Patient presents via EMS with reported agitation. Police on scene reported to EMS the patient was acting violently. Patient has been seen in the ED multiple times for dementia with behavioral disturbances. Patient denies any complaints. She reports a taking her medications as prescribed. No family members at bedside. Patient reports getting upset with her son last night. Denies SI/HI or hallucinations. VSS. On exam patient is alert to person and place. She is laying in bed comfortably, appears mildly upset but is following commands and answering questions. Medical screening workup ordered, unremarkable. Patient reports she is feeling better and is less upset at this time. I suspect patient's behavior is likely due to dementia and do not feel that inpatient management is  warranted at this time. Dr. Patria Mane evaluated patient and was able to contact patient's daughter over the phone who reports she will come pick up the patient for discharge home.    Satira Sark [place], [place] 10/12/15 1610  Azalia Bilis, MD 10/12/15 [phone]

## 2015-10-12 NOTE — ED Notes (Signed)
[name] daughter here to pick the patient up. Patient is yelling and screaming that she is not going back home. Patient does have dementia.

## 2015-10-20 ENCOUNTER — Ambulatory Visit (INDEPENDENT_AMBULATORY_CARE_PROVIDER_SITE_OTHER): Payer: Medicare PPO | Admitting: Neurology

## 2015-10-20 ENCOUNTER — Encounter: Payer: Self-pay | Admitting: Neurology

## 2015-10-20 VITALS — BP 150/63 | HR 66 | Ht 61.0 in | Wt 151.5 lb

## 2015-10-20 DIAGNOSIS — F0391 Unspecified dementia with behavioral disturbance: Secondary | ICD-10-CM | POA: Diagnosis not present

## 2015-10-20 DIAGNOSIS — F03918 Unspecified dementia, unspecified severity, with other behavioral disturbance: Secondary | ICD-10-CM

## 2015-10-20 MED ORDER — SERTRALINE HCL 50 MG PO TABS
100.0000 mg | ORAL_TABLET | Freq: Every day | ORAL | Status: DC
Start: 2015-10-20 — End: 2015-12-30

## 2015-10-20 MED ORDER — TRAZODONE HCL 100 MG PO TABS: 100.0000 mg | ORAL_TABLET | Freq: Every day | ORAL | Status: DC

## 2015-10-20 NOTE — Progress Notes (Signed)
Reason for visit: Dementia   Michele Cervantes is an [age] y.o. female  History of present illness:  Michele Cervantes is an 81 year old right-handedhanded black female with a history of dementia associated with a behavior disorder. The patient lives with her family, her daughter is around in the evening, her son is around during the daytime. The patient has an inversion of the day-night cycle, the patient has difficulty sleeping at night, but she sleeps until about noon the next day. The patient may become agitated at times, she has had 3 emergency room visits because of agitation since last seen. The patient was seen in the emergency room on 15 August, 27 August, and 21 November. The patient has been placed on low-dose Risperdal, she has been taken off of Ambien and placed on trazodone for sleep. This is not very effective for sleep, however. The patient is on Aricept taking 10 mg at night. She is also on Zoloft 50 mg daily. She is maintaining her appetite, and no significant weight loss is noted. The patient has lost 2 pounds since last seen. The patient admits to having problems with getting upset.  Past Medical History  Diagnosis Date  . Alzheimer disease   . Anginal pain (HCC)   . Hypercholesteremia   . Hypertension   . CHF (congestive heart failure) [facility](HCC)     Past Surgical History  Procedure Laterality Date  . Abdominal hysterectomy    . Thyroid lobectomy      Family History  Problem Relation Age of Onset  . CAD Mother   . Heart disease Mother   . Breast cancer Mother   . Dementia Mother   . Alcohol abuse Father   . Cancer Brother   . Dementia Brother     Social history:  reports that she has never smoked. She has never used smokeless tobacco. She reports that she does not drink alcohol or use illicit drugs.   No Known Allergies  Medications:  Prior to Admission medications   Medication Sig Start Date End Date Taking? Authorizing Provider  aspirin 81 MG tablet Take 81 mg by  mouth daily.   Yes Historical Provider, MD  donepezil (ARICEPT) 10 MG tablet Take 10 mg by mouth at bedtime. 10/10/15  Yes Historical Provider, MD  pravastatin (PRAVACHOL) 10 MG tablet Take 10 mg by mouth at bedtime.  02/21/15  Yes Historical Provider, MD  risperiDONE (RISPERDAL) 0.25 MG tablet Take 0.25 mg by mouth at bedtime.  07/14/15  Yes Historical Provider, MD  sertraline (ZOLOFT) 25 MG tablet One tablet daily for 1 week, then take 2 tablets daily Patient taking differently: Take 50 mg by mouth daily.  06/17/15  Yes York Spanielharles K Willis, MD  traZODone (DESYREL) 50 MG tablet Take 50 mg by mouth at bedtime as needed for sleep.   Yes Historical Provider, MD    ROS:  Out of a complete 14 system review of symptoms, the patient complains only of the following symptoms, and all other reviewed systems are negative.  Leg swelling Insomnia Memory loss, headache, tremors Agitation, behavior problem, depression, hallucinations  Blood pressure 150/63, pulse 66, height 5\' 1"  (1.549 m), weight 151 lb 8 oz (68.72 kg).  Physical Exam  General: The patient is alert and cooperative at the time of the examination.  Skin: 2 Plus edema below the knees is noted bilaterally.   Neurologic Exam  Mental status: The patient is alert and oriented x 2 at the time of the examination( not  oriented to date). The Mini-Mental Status examination done today shows a total score of 15/30. The patient is able to name 7 animals in 30 seconds.   Cranial nerves: Facial symmetry is present. Speech is normal, no aphasia or dysarthria is noted. Extraocular movements are full. Visual fields are full.  Motor: The patient has good strength in all 4 extremities.  Sensory examination: Soft touch sensation is symmetric on the face, arms, and legs.  Coordination: The patient has good finger-nose-finger and heel-to-shin bilaterally.  Gait and station: The patient has a normal gait. Tandem gait is slightly unsteady. Romberg is  negative. No drift is seen.  Reflexes: Deep tendon reflexes are symmetric.   Assessment/Plan:  One. Dementia with behavioral disturbance  The patient continues to have some periods of agitation. The patient will be increased on the Zoloft taking 100 mg daily. She will be increased on the trazodone 200 mg at night for sleep. She will continue the Aricept. I will see her back in about 6 months, the family will contact me if agitation remains a problem.  Marlan Palau MD 10/20/2015 8:42 PM  [facility] [address] [place], [place] [address]  Phone [phone] Fax [phone]

## 2015-10-20 NOTE — Patient Instructions (Signed)
    We will go up on the trazodone to 100 mg at night for sleep. Increase the sertralazine (zoloft) to 100 mg daily.

## 2015-11-19 ENCOUNTER — Other Ambulatory Visit: Payer: Self-pay

## 2015-11-19 ENCOUNTER — Encounter: Payer: Self-pay | Admitting: *Deleted

## 2015-11-19 DIAGNOSIS — F0281 Dementia in other diseases classified elsewhere with behavioral disturbance: Secondary | ICD-10-CM

## 2015-11-19 DIAGNOSIS — G309 Alzheimer's disease, unspecified: Principal | ICD-10-CM

## 2015-11-19 NOTE — Patient Outreach (Signed)
[facility](THN) Care Management  11/19/2015  Ozella RocksLillian W Barley 11/12/1929 [id number]   Referral Date: 11-12-15 [facility]  Referral Reason: ED Utilization- 5 ED visits for Dementia with agitation.   Outreach Attempt: Outreach #1-Reached daughter Dois DavenportSandra @ [phone].  Daughter handles patient affairs.   Social: Patient lives with daughter and son.  Daughter Dois DavenportSandra reports that her brother is with the patient during the day and she is there at night.    Conditions: Daughter reports patient has some dementia with agitation and the agitation is the cause for her ED utilization.  However, she reports that some changes with her medication has helped with her agitation.  She reports that now her agitation spells are not as bad and does not last long.    Medications: Daughter denies any problems with medications and they are able to afford patient medications.      Services:  Daughter is requesting some direction with obtaining resources to get legal matters straight such as guardianship and power of attorney.  She also is interested in any senior resources such as adult day programs for patient.   Plan: RN Health Coach will send [facility] packet with consent, Advanced Directive information, and some senior resource information.   RN Health Coach will refer to Social Work for assistance with resources.    Bary Lericheionne J Lovelle Lema, RN, MSN [facility] Care [facility] RN Telephonic Health Coach [phone]

## 2015-11-24 ENCOUNTER — Emergency Department (HOSPITAL_BASED_OUTPATIENT_CLINIC_OR_DEPARTMENT_OTHER): Payer: Medicare PPO

## 2015-11-24 ENCOUNTER — Encounter (HOSPITAL_BASED_OUTPATIENT_CLINIC_OR_DEPARTMENT_OTHER): Payer: Self-pay | Admitting: *Deleted

## 2015-11-24 ENCOUNTER — Emergency Department (HOSPITAL_BASED_OUTPATIENT_CLINIC_OR_DEPARTMENT_OTHER)
Admission: EM | Admit: 2015-11-24 | Discharge: 2015-11-24 | Disposition: A | Discharge status: Home / Self Care | Payer: Medicare PPO | Attending: Emergency Medicine | Admitting: Emergency Medicine

## 2015-11-24 DIAGNOSIS — Z7982 Long term (current) use of aspirin: Secondary | ICD-10-CM | POA: Diagnosis not present

## 2015-11-24 DIAGNOSIS — R600 Localized edema: Secondary | ICD-10-CM | POA: Insufficient documentation

## 2015-11-24 DIAGNOSIS — I209 Angina pectoris, unspecified: Secondary | ICD-10-CM | POA: Diagnosis not present

## 2015-11-24 DIAGNOSIS — E7800 Pure hypercholesterolemia, unspecified: Secondary | ICD-10-CM | POA: Insufficient documentation

## 2015-11-24 DIAGNOSIS — G309 Alzheimer's disease, unspecified: Secondary | ICD-10-CM | POA: Insufficient documentation

## 2015-11-24 DIAGNOSIS — I509 Heart failure, unspecified: Secondary | ICD-10-CM | POA: Diagnosis not present

## 2015-11-24 DIAGNOSIS — J209 Acute bronchitis, unspecified: Secondary | ICD-10-CM | POA: Diagnosis not present

## 2015-11-24 DIAGNOSIS — Z79899 Other long term (current) drug therapy: Secondary | ICD-10-CM | POA: Insufficient documentation

## 2015-11-24 DIAGNOSIS — R05 Cough: Secondary | ICD-10-CM | POA: Diagnosis present

## 2015-11-24 DIAGNOSIS — I10 Essential (primary) hypertension: Secondary | ICD-10-CM | POA: Diagnosis not present

## 2015-11-24 DIAGNOSIS — J40 Bronchitis, not specified as acute or chronic: Secondary | ICD-10-CM

## 2015-11-24 MED ORDER — GUAIFENESIN 100 MG/5ML PO LIQD: 100.0000 mg | ORAL | Status: DC | PRN

## 2015-11-24 MED ORDER — AZITHROMYCIN 250 MG PO TABS: ORAL_TABLET | ORAL | Status: DC

## 2015-11-24 NOTE — ED Provider Notes (Signed)
CSN: [id number]     Arrival date & time 11/24/15  1816 History   By signing my name below, I, Arlan Organ, attest that this documentation has been prepared under the direction and in the presence of Arby Barrette, MD.  Electronically Signed: Arlan Organ, ED Scribe. 11/24/2015. 8:59 PM.  Chief Complaint  Patient presents with  . Cough   The history is provided by a relative. No language interpreter was used.    HPI Comments: LARESA OSHIRO is a 82 y.o. female with a PMHx of alzheimer disease, anginal pain, HTN, and CHF who presents to the Emergency Department complaining of a constant, ongoing, productive cough x 4 days. Pt also reports congestion, and rhinorrhea. No aggravating or alleviating factors at this time. No OTC medications or home remedies attempted prior to arrival. Pt denies any shortness of breath, fever, or chest pain. Family denies any recent sick contacts. Pt received her Flu vaccination this season. No known allergies to medications.   PCP: Thayer Headings, MD    Past Medical History  Diagnosis Date  . Alzheimer disease   . Anginal pain (HCC)   . Hypercholesteremia   . Hypertension   . CHF (congestive heart failure) [facility])    Past Surgical History  Procedure Laterality Date  . Abdominal hysterectomy    . Thyroid lobectomy     Family History  Problem Relation Age of Onset  . CAD Mother   . Heart disease Mother   . Breast cancer Mother   . Dementia Mother   . Alcohol abuse Father   . Cancer Brother   . Dementia Brother    Social History  Substance Use Topics  . Smoking status: Never Smoker   . Smokeless tobacco: Never Used  . Alcohol Use: No   OB History    No data available     Review of Systems   A complete 10 system review of systems was obtained and all systems are negative except as noted in the HPI and PMH.    Allergies  Review of patient's allergies indicates no known allergies.  Home Medications   Prior to Admission medications    Medication Sig Start Date End Date Taking? Authorizing Provider  aspirin 81 MG tablet Take 81 mg by mouth daily.    Historical Provider, MD  azithromycin (ZITHROMAX Z-PAK) 250 MG tablet 2 po day one, then 1 daily x 4 days 11/24/15   Arby Barrette, MD  donepezil (ARICEPT) 10 MG tablet Take 10 mg by mouth at bedtime. 10/10/15   Historical Provider, MD  guaiFENesin (ROBITUSSIN) 100 MG/5ML liquid Take 5-10 mLs (100-200 mg total) by mouth every 4 (four) hours as needed for cough. 11/24/15   Arby Barrette, MD  pravastatin (PRAVACHOL) 10 MG tablet Take 10 mg by mouth at bedtime.  02/21/15   Historical Provider, MD  risperiDONE (RISPERDAL) 0.25 MG tablet Take 0.25 mg by mouth at bedtime.  07/14/15   Historical Provider, MD  sertraline (ZOLOFT) 50 MG tablet Take 2 tablets (100 mg total) by mouth daily. 10/20/15   York Spaniel, MD  traZODone (DESYREL) 100 MG tablet Take 1 tablet (100 mg total) by mouth at bedtime. 10/20/15   York Spaniel, MD   Triage Vitals: BP 153/70 mmHg  Pulse 67  Temp(Src) 98.6 F (37 C) (Oral)  Resp 20  Wt 151 lb (68.493 kg)  SpO2 94%   Physical Exam  Constitutional: She is oriented to person, place, and time. She appears well-developed and well-nourished. No  distress.  HENT:  Head: Normocephalic and atraumatic.  Mouth/Throat: Oropharynx is clear and moist.  Nasal mucosa bogginess and congestion.  Eyes: EOM are normal. Pupils are equal, round, and reactive to light.  Neck: Normal range of motion.  Cardiovascular: Normal rate, regular rhythm, normal heart sounds and intact distal pulses.   Pulmonary/Chest: Effort normal and breath sounds normal.  Abdominal: Soft. She exhibits no distension. There is no tenderness.  Musculoskeletal: Normal range of motion.  1+ pitting edema bilaterally. No calf tenderness.  Neurological: She is alert and oriented to person, place, and time. Coordination normal.  Skin: Skin is warm and dry.  Psychiatric: She has a normal mood and  affect.  Nursing note and vitals reviewed.   ED Course  Procedures (including critical care time)  DIAGNOSTIC STUDIES: Oxygen Saturation is 94% on RA, adequate by my interpretation.    COORDINATION OF CARE: 8:55 PM- Will order CXR. Discussed treatment plan with pt at bedside and pt agreed to plan.     Labs Review Labs Reviewed - No data to display  Imaging Review Dg Chest 2 View  11/24/2015  CLINICAL DATA:  Cough and shortness of breath for 3 days. EXAM: CHEST  2 VIEW COMPARISON:  07/06/2015 FINDINGS: The cardiomediastinal contours are normal. Mild chronic hyperinflation is unchanged. Pulmonary vasculature is normal. No consolidation, pleural effusion, or pneumothorax. No acute osseous abnormalities are seen. IMPRESSION: No acute pulmonary process. Electronically Signed   By: Rubye OaksMelanie  Ehinger M.D.   On: 11/24/2015 19:30   I have personally reviewed and evaluated these images and lab results as part of my medical decision-making.   EKG Interpretation None      MDM   Final diagnoses:  Bronchitis   Patient has had 3 days of productive cough. She is alert and appropriate. No hypoxia or respiratory distress. Patient be treated with the pack and guaifenesin. She is with her family member and they're counseled on signs and symptoms for which to return.  Arby BarretteMarcy Adalie Mand, MD 11/24/15 2109

## 2015-11-24 NOTE — Discharge Instructions (Signed)
Upper Respiratory Infection, Adult Most upper respiratory infections (URIs) are a viral infection of the air passages leading to the lungs. A URI affects the nose, throat, and upper air passages. The most common type of URI is nasopharyngitis and is typically referred to as "the common cold." URIs run their course and usually go away on their own. Most of the time, a URI does not require medical attention, but sometimes a bacterial infection in the upper airways can follow a viral infection. This is called a secondary infection. Sinus and middle ear infections are common types of secondary upper respiratory infections. Bacterial pneumonia can also complicate a URI. A URI can worsen asthma and chronic obstructive pulmonary disease (COPD). Sometimes, these complications can require emergency medical care and may be life threatening.  CAUSES Almost all URIs are caused by viruses. A virus is a type of germ and can spread from one person to another.  RISKS FACTORS You may be at risk for a URI if:   You smoke.   You have chronic heart or lung disease.  You have a weakened defense (immune) system.   You are very young or very old.   You have nasal allergies or asthma.  You work in crowded or poorly ventilated areas.  You work in health care facilities or schools. SIGNS AND SYMPTOMS  Symptoms typically develop 2-3 days after you come in contact with a cold virus. Most viral URIs last 7-10 days. However, viral URIs from the influenza virus (flu virus) can last 14-18 days and are typically more severe. Symptoms may include:   Runny or stuffy (congested) nose.   Sneezing.   Cough.   Sore throat.   Headache.   Fatigue.   Fever.   Loss of appetite.   Pain in your forehead, behind your eyes, and over your cheekbones (sinus pain).  Muscle aches.  DIAGNOSIS  Your health care provider may diagnose a URI by:  Physical exam.  Tests to check that your symptoms are not due to  another condition such as:  Strep throat.  Sinusitis.  Pneumonia.  Asthma. TREATMENT  A URI goes away on its own with time. It cannot be cured with medicines, but medicines may be prescribed or recommended to relieve symptoms. Medicines may help:  Reduce your fever.  Reduce your cough.  Relieve nasal congestion. HOME CARE INSTRUCTIONS   Take medicines only as directed by your health care provider.   Gargle warm saltwater or take cough drops to comfort your throat as directed by your health care provider.  Use a warm mist humidifier or inhale steam from a shower to increase air moisture. This may make it easier to breathe.  Drink enough fluid to keep your urine clear or pale yellow.   Eat soups and other clear broths and maintain good nutrition.   Rest as needed.   Return to work when your temperature has returned to normal or as your health care provider advises. You may need to stay home longer to avoid infecting others. You can also use a face mask and careful hand washing to prevent spread of the virus.  Increase the usage of your inhaler if you have asthma.   Do not use any tobacco products, including cigarettes, chewing tobacco, or electronic cigarettes. If you need help quitting, ask your health care provider. PREVENTION  The best way to protect yourself from getting a cold is to practice good hygiene.   Avoid oral or hand contact with people with cold   symptoms.   Wash your hands often if contact occurs.  There is no clear evidence that vitamin C, vitamin E, echinacea, or exercise reduces the chance of developing a cold. However, it is always recommended to get plenty of rest, exercise, and practice good nutrition.  SEEK MEDICAL CARE IF:   You are getting worse rather than better.   Your symptoms are not controlled by medicine.   You have chills.  You have worsening shortness of breath.  You have brown or red mucus.  You have yellow or brown nasal  discharge.  You have pain in your face, especially when you bend forward.  You have a fever.  You have swollen neck glands.  You have pain while swallowing.  You have white areas in the back of your throat. SEEK IMMEDIATE MEDICAL CARE IF:   You have severe or persistent:  Headache.  Ear pain.  Sinus pain.  Chest pain.  You have chronic lung disease and any of the following:  Wheezing.  Prolonged cough.  Coughing up blood.  A change in your usual mucus.  You have a stiff neck.  You have changes in your:  Vision.  Hearing.  Thinking.  Mood. MAKE SURE YOU:   Understand these instructions.  Will watch your condition.  Will get help right away if you are not doing well or get worse.   This information is not intended to replace advice given to you by your health care provider. Make sure you discuss any questions you have with your health care provider.   Document Released: 05/03/2001 Document Revised: 03/24/2015 Document Reviewed: 02/12/2014 Elsevier Interactive Patient Education 2016 Elsevier Inc.  

## 2015-11-24 NOTE — ED Notes (Signed)
Cough x 4 days

## 2015-11-24 NOTE — ED Notes (Signed)
C/o cough, congestion and runny nose x 4 days  Denies pain or fever

## 2015-11-26 ENCOUNTER — Other Ambulatory Visit: Payer: Self-pay | Admitting: *Deleted

## 2015-11-26 NOTE — Patient Outreach (Signed)
[facility]N) Care Management  11/26/2015  Michele Cervantes 12/01/1928 [id number]  CSW received a new referral on patient from patient's RNCM with [facility] Care Management, Michele Cervantes indicating that patient would benefit from social work services and resources.  More specifically, Michele Cervantes reported that patient's daughter, Michele Cervantes is requesting assistance in obtaining resources for legal matters, such as Guardianship and Power of Gerrit Friendsttorney documents  CSW agreed to mail Michele Cervantes an Biochemist, clinicalAdvanced Directives packet (including Living Will and [facility] Power of EllerbeAttorney documents).  Michele Cervantes went on to say that Michele Cervantes is also requesting information and resources for Adult Day Care Programs and Brink's CompanySenior Resources.  A Pensions consultantenior Resources brochure and information for the [facility] ([facility] Program will also be mailed to Michele Cervantes for her review.  Last, a [facility] Care Management Consent packet will be included in the information mailed to Michele Cervantes. Per patient's request, CSW made an initial attempt to try and contact patient's daughter, Michele Cervantes today to perform phone assessment, as well as assess and assist with social needs and services, without success.  A HIPAA complaint message was left for Michele Cervantes on [employer]icemail.  CSW is currently awaiting a return call.  Danford BadJoanna Kyllian Clingerman, BSW, MSW, LCSW  Licensed Restaurant manager, fast foodClinical Social Worker  [facility] Care Management [facility]  Mailing [address], [place], [place] [address] Physical Address-[address], [place], [place] [address] Toll Free Main # [phone] Fax # [phone] Cell # [phone]  Fax # [phone]  [email or web address]

## 2015-11-30 ENCOUNTER — Ambulatory Visit: Payer: Self-pay | Admitting: *Deleted

## 2015-12-02 ENCOUNTER — Other Ambulatory Visit: Payer: Self-pay | Admitting: *Deleted

## 2015-12-02 NOTE — Patient Outreach (Signed)
[facility]) Care Management  12/02/2015  Michele Cervantes 1929/06/20 [id number]   Per patient's request, CSW made a second attempt to try and contact patient's daughter, [name] today to perform phone assessment on patient, as well as assess and assist with social work needs and services, without success. A HIPPA compliant message was left for [name] on [employer].  CSW is currently awaiting a return call.  Danford Bad, BSW, MSW, LCSW  Licensed Restaurant manager, fast food Health System  Mailing [address] [place], [place] [address] Physical Address-[address], [place], [place] [address] Toll Free Main # [phone] Fax # [phone] Cell # [phone]  Fax # [phone]  [email or web address]

## 2015-12-09 ENCOUNTER — Encounter: Payer: Self-pay | Admitting: *Deleted

## 2015-12-09 ENCOUNTER — Ambulatory Visit: Payer: Self-pay | Admitting: *Deleted

## 2015-12-09 ENCOUNTER — Other Ambulatory Visit: Payer: Self-pay | Admitting: *Deleted

## 2015-12-09 NOTE — Patient Outreach (Signed)
[facility]) Care Management  12/09/2015  [name] 1929-03-08 [id number]  CSW was able to make initial contact with patient's, Michele Cervantes today to perform phone assessment, as well as assess and assist with social work needs and services for patient.  CSW introduced self, explained role and types of services provided through [place] Management ([place] [facility]). CSW further explained to [name] that CSW works with Jon Billings, Telephonic Nurse Case Manager, also with [place] Management. CSW then explained the reason for the call, indicating that [name] thought that [name] would benefit from social work services and resources to assist with completion of Advanced Directives for patient.  CSW obtained two HIPAA compliant identifiers from [name], which included patient's name and date of birth. CSW agreed to provide Michele Cervantes with an Financial controller ([place] documents) packet, but explained to [name] that CSW would not be able to assist with completion.  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  [name] voiced understanding, reporting that she is currently working with the [facility] to become patient's legal guardian.  CSW was able to answer questions appropriately.  No additional social work needs identified at this time. CSW will perform a case closure on patient, as all goals of treatment have been met from social work standpoint and no additional social work needs have been identified at this time. CSW will notify patient's RNCM with [place] Management, Jon Billings of CSW's plans to close patient's case. CSW will fax a correspondence letter to patient's Primary Care Physician, Dr.  Thressa Cervantes to ensure that Dr. Noah Cervantes is aware of CSW's involvement with patient. CSW will submit a case closure request to Lurline Del, Care Management Assistant with [place] Management, in the form of an In [employer].  CSW will ensure that Mrs. Laurance Flatten is aware of Aleatha Borer, RNCM with [facility] Management, continued involvement with patient's care.  Nat Christen, BSW, MSW, LCSW  Licensed Education officer, environmental Health System  Mailing [address], [place], [place] [address] Physical Address-[address], [place], [place] [address] Toll Free Main # [phone] Fax # [phone] Cell # [phone]  Fax # [phone]  [email or web address]   [facility] complies with [employer] civil rights laws and does not discriminate on the basis of race, color, national origin, age, disability, or sex.  Espaol (Spanish)  [facility] cumple con las leyes federales de derechos civiles aplicables y no discrimina por motivos de raza, color, nacionalidad, edad, discapacidad o sexo.     Ti?ng Vi?t ([place])  [place] tun th? lu?t dn quy?n hi?n hnh c?a Lin bang v khng phn bi?t ?i x? d?a trn ch?ng t?c, mu da, ngu?n g?c qu?c gia, ? tu?i, khuy?t t?t, ho?c gi?i tnh.     (Arabic)

## 2015-12-12 ENCOUNTER — Emergency Department (HOSPITAL_COMMUNITY)
Admission: EM | Admit: 2015-12-12 | Discharge: 2015-12-14 | Disposition: A | Discharge status: Other Acute Inpatient Hospital | Payer: Medicare PPO | Attending: Emergency Medicine | Admitting: Emergency Medicine

## 2015-12-12 ENCOUNTER — Encounter (HOSPITAL_COMMUNITY): Payer: Self-pay | Admitting: *Deleted

## 2015-12-12 DIAGNOSIS — I509 Heart failure, unspecified: Secondary | ICD-10-CM | POA: Insufficient documentation

## 2015-12-12 DIAGNOSIS — F0281 Dementia in other diseases classified elsewhere with behavioral disturbance: Secondary | ICD-10-CM | POA: Insufficient documentation

## 2015-12-12 DIAGNOSIS — G309 Alzheimer's disease, unspecified: Secondary | ICD-10-CM | POA: Diagnosis not present

## 2015-12-12 DIAGNOSIS — Z79899 Other long term (current) drug therapy: Secondary | ICD-10-CM | POA: Insufficient documentation

## 2015-12-12 DIAGNOSIS — F03918 Unspecified dementia, unspecified severity, with other behavioral disturbance: Secondary | ICD-10-CM | POA: Diagnosis present

## 2015-12-12 DIAGNOSIS — R9431 Abnormal electrocardiogram [ECG] [EKG]: Secondary | ICD-10-CM | POA: Diagnosis not present

## 2015-12-12 DIAGNOSIS — F22 Delusional disorders: Secondary | ICD-10-CM | POA: Diagnosis not present

## 2015-12-12 DIAGNOSIS — F911 Conduct disorder, childhood-onset type: Secondary | ICD-10-CM | POA: Insufficient documentation

## 2015-12-12 DIAGNOSIS — N390 Urinary tract infection, site not specified: Secondary | ICD-10-CM | POA: Diagnosis not present

## 2015-12-12 DIAGNOSIS — R45851 Suicidal ideations: Secondary | ICD-10-CM | POA: Diagnosis not present

## 2015-12-12 DIAGNOSIS — I10 Essential (primary) hypertension: Secondary | ICD-10-CM | POA: Diagnosis not present

## 2015-12-12 DIAGNOSIS — Z046 Encounter for general psychiatric examination, requested by authority: Secondary | ICD-10-CM | POA: Insufficient documentation

## 2015-12-12 DIAGNOSIS — Z7982 Long term (current) use of aspirin: Secondary | ICD-10-CM | POA: Insufficient documentation

## 2015-12-12 DIAGNOSIS — R454 Irritability and anger: Secondary | ICD-10-CM | POA: Insufficient documentation

## 2015-12-12 DIAGNOSIS — Z01818 Encounter for other preprocedural examination: Secondary | ICD-10-CM

## 2015-12-12 DIAGNOSIS — F0391 Unspecified dementia with behavioral disturbance: Secondary | ICD-10-CM | POA: Diagnosis not present

## 2015-12-12 DIAGNOSIS — E7800 Pure hypercholesterolemia, unspecified: Secondary | ICD-10-CM | POA: Insufficient documentation

## 2015-12-12 DIAGNOSIS — R451 Restlessness and agitation: Secondary | ICD-10-CM | POA: Diagnosis present

## 2015-12-12 LAB — COMPREHENSIVE METABOLIC PANEL
ALBUMIN: 3.7 g/dL (ref 3.5–5.0)
ALK PHOS: 93 U/L (ref 38–126)
ALT: 12 U/L — ABNORMAL LOW (ref 14–54)
ANION GAP: 8 (ref 5–15)
AST: 17 U/L (ref 15–41)
BILIRUBIN TOTAL: 0.9 mg/dL (ref 0.3–1.2)
BUN: 10 mg/dL (ref 6–20)
CALCIUM: 9.1 mg/dL (ref 8.9–10.3)
CO2: 27 mmol/L (ref 22–32)
Chloride: 107 mmol/L (ref 101–111)
Creatinine, Ser: 0.79 mg/dL (ref 0.44–1.00)
Glucose, Bld: 89 mg/dL (ref 65–99)
POTASSIUM: 3.4 mmol/L — AB (ref 3.5–5.1)
Sodium: 142 mmol/L (ref 135–145)
TOTAL PROTEIN: 7.5 g/dL (ref 6.5–8.1)

## 2015-12-12 LAB — CBC WITH DIFFERENTIAL/PLATELET
BASOS ABS: 0 10*3/uL (ref 0.0–0.1)
BASOS PCT: 1 %
EOS ABS: 0.1 10*3/uL (ref 0.0–0.7)
Eosinophils Relative: 1 %
HEMATOCRIT: 38.7 % (ref 36.0–46.0)
HEMOGLOBIN: 12.3 g/dL (ref 12.0–15.0)
Lymphocytes Relative: 28 %
Lymphs Abs: 1.7 10*3/uL (ref 0.7–4.0)
MCH: 28.2 pg (ref 26.0–34.0)
MCHC: 31.8 g/dL (ref 30.0–36.0)
MCV: 88.8 fL (ref 78.0–100.0)
Monocytes Absolute: 0.6 10*3/uL (ref 0.1–1.0)
Monocytes Relative: 10 %
NEUTROS ABS: 3.7 10*3/uL (ref 1.7–7.7)
NEUTROS PCT: 60 %
Platelets: 271 10*3/uL (ref 150–400)
RBC: 4.36 MIL/uL (ref 3.87–5.11)
RDW: 13.6 % (ref 11.5–15.5)
WBC: 6 10*3/uL (ref 4.0–10.5)

## 2015-12-12 LAB — URINALYSIS, ROUTINE W REFLEX MICROSCOPIC
Glucose, UA: NEGATIVE mg/dL
HGB URINE DIPSTICK: NEGATIVE
Ketones, ur: NEGATIVE mg/dL
Nitrite: NEGATIVE
PROTEIN: NEGATIVE mg/dL
Specific Gravity, Urine: 1.027 (ref 1.005–1.030)
pH: 5.5 (ref 5.0–8.0)

## 2015-12-12 LAB — ETHANOL

## 2015-12-12 LAB — ACETAMINOPHEN LEVEL: Acetaminophen (Tylenol), Serum: <10 ug/mL — ABNORMAL LOW (ref 10–30)

## 2015-12-12 LAB — RAPID URINE DRUG SCREEN, HOSP PERFORMED
Amphetamines: NOT DETECTED
BARBITURATES: NOT DETECTED
BENZODIAZEPINES: NOT DETECTED
COCAINE: NOT DETECTED
OPIATES: NOT DETECTED
TETRAHYDROCANNABINOL: NOT DETECTED

## 2015-12-12 LAB — URINE MICROSCOPIC-ADD ON: RBC / HPF: NONE SEEN RBC/hpf (ref 0–5)

## 2015-12-12 LAB — SALICYLATE LEVEL: Salicylate Lvl: <4 mg/dL (ref 2.8–30.0)

## 2015-12-12 MED ORDER — ASPIRIN EC 81 MG PO TBEC
81.0000 mg | DELAYED_RELEASE_TABLET | Freq: Every day | ORAL | Status: DC
  Administered 2015-12-13 – 2015-12-14 (×2): 81 mg via ORAL
  Filled 2015-12-12 (×2): qty 1

## 2015-12-12 MED ORDER — SERTRALINE HCL 50 MG PO TABS
100.0000 mg | ORAL_TABLET | Freq: Every day | ORAL | Status: DC
  Administered 2015-12-12 – 2015-12-14 (×3): 100 mg via ORAL
  Filled 2015-12-12 (×3): qty 2

## 2015-12-12 MED ORDER — TRAZODONE HCL 100 MG PO TABS
100.0000 mg | ORAL_TABLET | Freq: Every day | ORAL | Status: DC
  Administered 2015-12-12 – 2015-12-13 (×2): 100 mg via ORAL
  Filled 2015-12-12 (×2): qty 1

## 2015-12-12 MED ORDER — CEPHALEXIN 500 MG PO CAPS
500.0000 mg | ORAL_CAPSULE | Freq: Two times a day (BID) | ORAL | Status: DC
  Administered 2015-12-12 – 2015-12-14 (×5): 500 mg via ORAL
  Filled 2015-12-12 (×5): qty 1

## 2015-12-12 MED ORDER — CEPHALEXIN 500 MG PO CAPS: 500.0000 mg | ORAL_CAPSULE | Freq: Two times a day (BID) | ORAL | Status: DC

## 2015-12-12 MED ORDER — ALUM & MAG HYDROXIDE-SIMETH 200-200-20 MG/5ML PO SUSP: 30.0000 mL | ORAL | Status: DC | PRN

## 2015-12-12 MED ORDER — DONEPEZIL HCL 5 MG PO TABS
10.0000 mg | ORAL_TABLET | Freq: Every day | ORAL | Status: DC
  Administered 2015-12-12 – 2015-12-13 (×2): 10 mg via ORAL
  Filled 2015-12-12 (×2): qty 2

## 2015-12-12 MED ORDER — ACETAMINOPHEN 325 MG PO TABS
650.0000 mg | ORAL_TABLET | ORAL | Status: DC | PRN
  Administered 2015-12-14: 650 mg via ORAL
  Filled 2015-12-12: qty 2

## 2015-12-12 MED ORDER — IBUPROFEN 200 MG PO TABS: 600.0000 mg | ORAL_TABLET | Freq: Three times a day (TID) | ORAL | Status: DC | PRN

## 2015-12-12 MED ORDER — PRAVASTATIN SODIUM 10 MG PO TABS
10.0000 mg | ORAL_TABLET | Freq: Every day | ORAL | Status: DC
  Administered 2015-12-12 – 2015-12-13 (×2): 10 mg via ORAL
  Filled 2015-12-12 (×4): qty 1

## 2015-12-12 MED ORDER — RISPERIDONE 0.5 MG PO TABS
0.2500 mg | ORAL_TABLET | Freq: Every day | ORAL | Status: DC
  Administered 2015-12-12 – 2015-12-13 (×2): 0.25 mg via ORAL
  Filled 2015-12-12 (×2): qty 1

## 2015-12-12 MED ORDER — ONDANSETRON HCL 4 MG PO TABS: 4.0000 mg | ORAL_TABLET | Freq: Three times a day (TID) | ORAL | Status: DC | PRN

## 2015-12-12 NOTE — Discharge Instructions (Signed)
Stay very well hydrated with plenty of water throughout the day. Take antibiotic for a total of 7 days, including the days you were in the hospital and were given the antibiotic. Once you're discharged home, follow up with primary care physician in 1 week for recheck of ongoing symptoms but return to ER for emergent changing or worsening of symptoms. Please seek immediate care if you develop the following: You develop back pain.  Your symptoms are no better, or worse in 3 days. There is severe back pain or lower abdominal pain.  You develop chills.  You have a fever.  There is nausea or vomiting.  There is continued burning or discomfort with urination.     Urinary Tract Infection A urinary tract infection (UTI) can occur any place along the urinary tract. The tract includes the kidneys, ureters, bladder, and urethra. A type of germ called bacteria often causes a UTI. UTIs are often helped with antibiotic medicine.  HOME CARE   If given, take antibiotics as told by your doctor. Finish them even if you start to feel better.  Drink enough fluids to keep your pee (urine) clear or pale yellow.  Avoid tea, drinks with caffeine, and bubbly (carbonated) drinks.  Pee often. Avoid holding your pee in for a long time.  Pee before and after having sex (intercourse).  Wipe from front to back after you poop (bowel movement) if you are a woman. Use each tissue only once. GET HELP RIGHT AWAY IF:   You have back pain.  You have lower belly (abdominal) pain.  You have chills.  You feel sick to your stomach (nauseous).  You throw up (vomit).  Your burning or discomfort with peeing does not go away.  You have a fever.  Your symptoms are not better in 3 days. MAKE SURE YOU:   Understand these instructions.  Will watch your condition.  Will get help right away if you are not doing well or get worse.   This information is not intended to replace advice given to you by your health care  provider. Make sure you discuss any questions you have with your health care provider.   Document Released: 04/25/2008 Document Revised: 11/28/2014 Document Reviewed: 06/07/2012 Elsevier Interactive Patient Education [employer]

## 2015-12-12 NOTE — ED Provider Notes (Signed)
CSN: [id number]     Arrival date & time 12/12/15  1151 History   First MD Initiated Contact with Patient 12/12/15 1216     Chief Complaint  Patient presents with  . Agitation     (Consider location/radiation/quality/duration/timing/severity/associated sxs/prior Treatment) HPI Comments: Michele Cervantes is a 82 y.o. female with a PMHx of alzheimer's dementia, HTN, CHF, HLD, and angina, who presents to the ED brought in by [facility] and accompanied by her daughter who took out IVC paperwork on the patient. IVC paperwork states "patient has dementia with agitation, refuses to take medicines, height food and is refusing to eat, stands in the middle of the road stating she is going to stand out in the road and let a car run over her, thinks someone is going to hurt her, thinks people are whispering about her constantly, and is hiding knives." [name] daughter reports that today she ran away and they had to call 911 to help find her. Daughter states that this happens when she stops taking her medications, and when they went to her house today it appears as though she may have stopped taking them. Patient refuses that this is true, states she takes her medicine when she is posterior, but cannot recall when she last took it. Patient denies any of these accusations, denies SI/HI/AVH. States she is very angry with her daughter right now. She denies being an alcohol user, illicit drug user, or smoker. She repeatedly states that "her mind is everywhere because she is upset" and therefore history is limited somewhat by her dementia and her current mental state. [name] daughter states that this is her baseline when she's off her medication. Patient denies any medical complaints including headache, vision changes, chest pain, shortness of breath, abdominal pain, nausea, vomiting, diarrhea, urinary symptoms, numbness, tingling, or focal weakness.  Patient is a 82 y.o. female presenting with mental health disorder. The  history is provided by the patient, medical records and a caregiver. The history is limited by the condition of the patient. No language interpreter was used.  Mental Health Problem Presenting symptoms: aggressive behavior, agitation, paranoid behavior (per daughter), suicidal thoughts (per daughter) and suicidal threats (per daughter)   Patient accompanied by:  Family member and Patent examiner Degree of incapacity (severity):  Moderate Onset quality:  Unable to specify Timing:  Unable to specify Progression:  Unable to specify Chronicity:  Chronic Context: noncompliance   Treatment compliance:  Unable to specify Relieved by:  None tried Worsened by:  Family interactions Ineffective treatments:  None tried Associated symptoms: no abdominal pain, no chest pain and no headaches   Risk factors: hx of mental illness and recent psychiatric admission     Past Medical History  Diagnosis Date  . Alzheimer disease   . Anginal pain (HCC)   . Hypercholesteremia   . Hypertension   . CHF (congestive heart failure) [facility])    Past Surgical History  Procedure Laterality Date  . Abdominal hysterectomy    . Thyroid lobectomy     Family History  Problem Relation Age of Onset  . CAD Mother   . Heart disease Mother   . Breast cancer Mother   . Dementia Mother   . Alcohol abuse Father   . Cancer Brother   . Dementia Brother    Social History  Substance Use Topics  . Smoking status: Never Smoker   . Smokeless tobacco: Never Used  . Alcohol Use: No   OB History    No data  available     Review of Systems  Unable to perform ROS: Dementia  Constitutional: Negative for fever and chills.  Eyes: Negative for visual disturbance.  Respiratory: Negative for shortness of breath.   Cardiovascular: Negative for chest pain.  Gastrointestinal: Negative for nausea, vomiting, abdominal pain and diarrhea.  Genitourinary: Negative for dysuria and hematuria.  Musculoskeletal: Negative for myalgias  and arthralgias.  Skin: Negative for color change.  Allergic/Immunologic: Negative for immunocompromised state.  Neurological: Negative for weakness, numbness and headaches.  Psychiatric/Behavioral: Positive for suicidal ideas (per daughter), paranoia (per daughter) and agitation. Negative for confusion.       +paranoid thoughts per daughter's report  .    Allergies  Review of patient's allergies indicates no known allergies.  Home Medications   Prior to Admission medications   Medication Sig Start Date End Date Taking? Authorizing Provider  aspirin 81 MG tablet Take 81 mg by mouth daily.    Historical Provider, MD  azithromycin (ZITHROMAX Z-PAK) 250 MG tablet 2 po day one, then 1 daily x 4 days 11/24/15   Arby Barrette, MD  donepezil (ARICEPT) 10 MG tablet Take 10 mg by mouth at bedtime. 10/10/15   Historical Provider, MD  guaiFENesin (ROBITUSSIN) 100 MG/5ML liquid Take 5-10 mLs (100-200 mg total) by mouth every 4 (four) hours as needed for cough. 11/24/15   Arby Barrette, MD  pravastatin (PRAVACHOL) 10 MG tablet Take 10 mg by mouth at bedtime.  02/21/15   Historical Provider, MD  risperiDONE (RISPERDAL) 0.25 MG tablet Take 0.25 mg by mouth at bedtime.  07/14/15   Historical Provider, MD  sertraline (ZOLOFT) 50 MG tablet Take 2 tablets (100 mg total) by mouth daily. 10/20/15   York Spaniel, MD  traZODone (DESYREL) 100 MG tablet Take 1 tablet (100 mg total) by mouth at bedtime. 10/20/15   York Spaniel, MD   Triage VS: BP 204/70 mmHg  Pulse 92  Temp(Src) 98 F (36.7 C) (Oral)  Resp 22  SpO2 98% Repeat VS: BP 161/75 mmHg  Pulse 70  Temp(Src) 98 F (36.7 C) (Oral)  Resp 16  SpO2 96%  Physical Exam  Constitutional: She appears well-developed and well-nourished.  Non-toxic appearance. She appears distressed (screaming, crying).  Afebrile, nontoxic, very upset, screaming at her daughter, crying at times. BP elevated, but pt won't stay still for accurate reading  HENT:  Head:  Normocephalic and atraumatic.  Mouth/Throat: Oropharynx is clear and moist and mucous membranes are normal.  Eyes: Conjunctivae and EOM are normal. Right eye exhibits no discharge. Left eye exhibits no discharge.  Neck: Normal range of motion. Neck supple.  Cardiovascular: Normal rate, regular rhythm, normal heart sounds and intact distal pulses.  Exam reveals no gallop and no friction rub.   No murmur heard. Pulmonary/Chest: Effort normal and breath sounds normal. No respiratory distress. She has no decreased breath sounds. She has no wheezes. She has no rhonchi. She has no rales.  Abdominal: Soft. Normal appearance and bowel sounds are normal. She exhibits no distension. There is no tenderness. There is no rigidity, no rebound, no guarding, no CVA tenderness, no tenderness at McBurney's point and negative Murphy's sign.  Musculoskeletal: Normal range of motion.  MAE x4 Strength and sensation grossly intact Distal pulses intact Gait steady  Neurological: She is alert. She has normal strength. No cranial nerve deficit or sensory deficit. Gait normal. GCS eye subscore is 4. GCS verbal subscore is 5. GCS motor subscore is 6.  No focal neuro deficits, CN2-12  grossly intact Pt alert and oriented to self and place, but refuses to answer what year/month it is stating that "her mind is everywhere because I'm upset with my daughter, I can't answer that!"  Skin: Skin is warm, dry and intact. No rash noted.  Psychiatric: Her affect is angry. She is agitated. Thought content is paranoid (per daughter's reports). Cognition and memory are impaired. She expresses suicidal (per daughter's reports) ideation. She expresses no homicidal ideation. She expresses no suicidal plans and no homicidal plans.  Agitated and angry, screaming that she's upset with her daughter. Denies SI/HI/AVH, but pt's daughter states she has had suicidal behavior in the past stating she has tried to go into traffic. Pt's daughter reports  that pt thinks people are whispering about her and thinks someone is going to hurt her Impaired memory, at baseline per daughter  Nursing note and vitals reviewed.   ED Course  Procedures (including critical care time) Labs Review Labs Reviewed  COMPREHENSIVE METABOLIC PANEL - Abnormal; Notable for the following:    Potassium 3.4 (*)    ALT 12 (*)    All other components within normal limits  ACETAMINOPHEN LEVEL - Abnormal; Notable for the following:    Acetaminophen (Tylenol), Serum <10 (*)    All other components within normal limits  URINALYSIS, ROUTINE W REFLEX MICROSCOPIC (NOT AT [facility]) - Abnormal; Notable for the following:    APPearance CLOUDY (*)    Bilirubin Urine SMALL (*)    Leukocytes, UA SMALL (*)    All other components within normal limits  URINE MICROSCOPIC-ADD ON - Abnormal; Notable for the following:    Squamous Epithelial / LPF 6-30 (*)    Bacteria, UA MANY (*)    All other components within normal limits  URINE CULTURE  ETHANOL  CBC WITH DIFFERENTIAL/PLATELET  URINE RAPID DRUG SCREEN, HOSP PERFORMED  SALICYLATE LEVEL    Imaging Review No results found. I have personally reviewed and evaluated these images and lab results as part of my medical decision-making.   EKG Interpretation   Date/Time:  Saturday December 12 2015 14:02:39 EST Ventricular Rate:  70 PR Interval:  191 QRS Duration: 88 QT Interval:  383 QTC Calculation: 413 R Axis:   74 Text Interpretation:  Normal sinus rhythm Left ventricular hypertrophy No  old tracing to compare Confirmed by RAY MD, Duwayne Heck [phone]) on 12/12/2015  2:24:32 PM      MDM   Final diagnoses:  Dementia with behavioral disturbance  HTN (hypertension), benign  Irritability and anger  Nonspecific abnormal electrocardiogram (ECG) (EKG)  Involuntary commitment    82 y.o. female brought in by her daughter and [employer] with IVC paperwork stating pt is off her meds, agitated and tried to run away, has stood in the  middle of the road stating she's going to let a car run her over. Pt denies any SI/HI/AVH but admits to being angry with her family. Denies all of the accusations that her daughter reports. Has had several visits in the past for similar issues. Will not answer my orientation questions stating she's too upset to recall anything right now, but no focal neuro deficits are noted, and pt's family states she's at her mental baseline considering she's not taking her meds  Pt very upset during eval. BP elevated, but pt denies any symptoms. Pt won't stay still for BP to get accurate reading. Will attempt to recheck once pt calmed. Will get clearance labs and TTS consult. Will reassess shortly.   2:05 PM  TTS stating that she will be observed overnight and then reassessed in the morning. Labs have not yet been drawn, will have this done. BP rechecked and improved down to 161/75. Awaiting med clearance labs before she has holding orders placed and home meds re-ordered.  3:26 PM UDS neg. U/A with small leuks, 6-30 squamous, 0-5 WBC, but many bacteria. Most likely from contaminant, but given age and psych complaints, will treat. Will send for culture as well. Will start on keflex, will give family a printed script as well, discussed to only take for a 7 day course, may have pills left over from the Rx since it's written for the full 7 days but she's not leaving yet, and will be getting keflex here. Aside from that, CMP WNL, EtOH neg, Salicylate neg, acetaminophen neg. EKG with LVH but no prior to compare, no other concerning findings. Pt medically cleared at this time, will reorder home meds and psych hold orders. Please see [facility] notes for further documentation of care  BP 161/75 mmHg  Pulse 70  Temp(Src) 98 F (36.7 C) (Oral)  Resp 16  SpO2 96%  Meds ordered this encounter  Medications  . cephALEXin (KEFLEX) capsule 500 mg    Sig:   . aspirin tablet 81 mg    Sig:   . donepezil (ARICEPT) tablet 10 mg    Sig:    . pravastatin (PRAVACHOL) tablet 10 mg    Sig:   . risperiDONE (RISPERDAL) tablet 0.25 mg    Sig:   . sertraline (ZOLOFT) tablet 100 mg    Sig:   . traZODone (DESYREL) tablet 100 mg    Sig:   . alum & mag hydroxide-simeth (MAALOX/MYLANTA) 200-200-20 MG/5ML suspension 30 mL    Sig:   . ondansetron (ZOFRAN) tablet 4 mg    Sig:   . ibuprofen (ADVIL,MOTRIN) tablet 600 mg    Sig:   . acetaminophen (TYLENOL) tablet 650 mg    Sig:   . cephALEXin (KEFLEX) 500 MG capsule    Sig: Take 1 capsule (500 mg total) by mouth 2 (two) times daily. x 7 days total (including the days that you were in the hospital)    Dispense:  14 capsule    Refill:  0    Order Specific Question:  Supervising Provider    Answer:  Eusebio Friendly     Saleha Kalp Camprubi-Soms, PA-C 12/12/15 1535  Margarita Grizzle, MD 12/14/15 1645

## 2015-12-12 NOTE — ED Notes (Signed)
Pt is very tearful stating she did the best she could raising her 4 children. Pt requested a bible and something to eat and rink. Pt is sitting up in the cardiac chair with her daughter at the bedside. Pt was encouraged to drink water and cranberry juice.

## 2015-12-12 NOTE — BH Assessment (Signed)
Assessment Note  Michele Cervantes is an 82 y.o. female. Patient was brought into the ED by [employer] under IVC initiated by her daughter for running away from home, aggression, and non-compliance with medications.  Patient denies all allegations on IVC.  According the daughter, the daughter is not taking her medication and is agitated, thinking people are whispering about her, and leaving the home to stand in the road.     This Clinical research associate consulted with [name], NP it is recommended to re-evaluated in the AM and restart medications.    Diagnosis: Dementia with Behavioral Disturbance  Past Medical History:  Past Medical History  Diagnosis Date  . Alzheimer disease   . Anginal pain (HCC)   . Hypercholesteremia   . Hypertension   . CHF (congestive heart failure) [facility])     Past Surgical History  Procedure Laterality Date  . Abdominal hysterectomy    . Thyroid lobectomy      Family History:  Family History  Problem Relation Age of Onset  . CAD Mother   . Heart disease Mother   . Breast cancer Mother   . Dementia Mother   . Alcohol abuse Father   . Cancer Brother   . Dementia Brother     Social History:  reports that she has never smoked. She has never used smokeless tobacco. She reports that she does not drink alcohol or use illicit drugs.  Additional Social History:  Alcohol / Drug Use Pain Medications: see chart Prescriptions: see chart Over the Counter: see chart History of alcohol / drug use?: No history of alcohol / drug abuse  CIWA: CIWA-Ar BP: 161/75 mmHg Pulse Rate: 70 COWS:    Allergies: No Known Allergies  Home Medications:  (Not in a hospital admission)  OB/GYN Status:  No LMP recorded. Patient has had a hysterectomy.  General Assessment Data Location of Assessment: [facility] ED TTS Assessment: In system Is this a Tele or Face-to-Face Assessment?: Face-to-Face Is this an Initial Assessment or a Re-assessment for this encounter?: Initial Assessment Marital status:  Widowed Is patient pregnant?: No Pregnancy Status: No Living Arrangements: Children Can pt return to current living arrangement?: Yes Admission Status: Involuntary Is patient capable of signing voluntary admission?: No Referral Source: Self/Family/Friend Insurance type: Land Exam [facility] Walk-in ONLY) Medical Exam completed: Yes  Crisis Care Plan Living Arrangements: Children Legal Guardian: Other: (Daughter) Name of Psychiatrist: none Name of Therapist: none  Education Status Current Grade: na  Risk to self with the past 6 months Suicidal Ideation: No-Not Currently/Within Last 6 Months Has patient been a risk to self within the past 6 months prior to admission? : Other (comment) Suicidal Intent: No-Not Currently/Within Last 6 Months Has patient had any suicidal intent within the past 6 months prior to admission? : No Is patient at risk for suicide?: No Suicidal Plan?: No-Not Currently/Within Last 6 Months Has patient had any suicidal plan within the past 6 months prior to admission? : No Access to Means: No What has been your use of drugs/alcohol within the last 12 months?: na Previous Attempts/Gestures: No How many times?: 0 Intentional Self Injurious Behavior: None Family Suicide History: No Recent stressful life event(s): Conflict (Comment), Other (Comment) (dx Dementia) Persecutory voices/beliefs?: No Depression: No Depression Symptoms: Feeling angry/irritable Substance abuse history and/or treatment for substance abuse?: No  Risk to Others within the past 6 months Homicidal Ideation: No-Not Currently/Within Last 6 Months Does patient have any lifetime risk of violence toward others beyond the six  months prior to admission? : No Thoughts of Harm to Others: No-Not Currently Present/Within Last 6 Months Current Homicidal Intent: No-Not Currently/Within Last 6 Months Current Homicidal Plan: No-Not Currently/Within Last 6 Months Access to Homicidal  Means: No Identified Victim: na History of harm to others?: No Assessment of Violence: None Noted Violent Behavior Description: verbal aggression toward adult children Does patient have access to weapons?: No Criminal Charges Pending?: No Does patient have a court date: No Is patient on probation?: No  Psychosis Hallucinations: None noted Delusions: Unspecified  Mental Status Report Appearance/Hygiene: Unremarkable Eye Contact: Good Motor Activity: Freedom of movement Speech: Argumentative Level of Consciousness: Restless Mood: Angry Affect: Anxious, Blunted Anxiety Level: Moderate Judgement: Impaired Orientation: Person, Place Obsessive Compulsive Thoughts/Behaviors: None  Cognitive Functioning Concentration: Poor Memory: Remote Impaired, Recent Impaired IQ:  (unable to assess) Insight: Poor Impulse Control: Poor Appetite: Fair Weight Loss: 0 Weight Gain: 0 Sleep: Decreased Total Hours of Sleep: 4 Vegetative Symptoms: None  ADLScreening [facility] Assessment Services) [name] cognitive ability adequate to safely complete daily activities?: Yes Patient able to express need for assistance with ADLs?: Yes Independently performs ADLs?: Yes (appropriate for developmental age)  Prior Inpatient Therapy Prior Inpatient Therapy: No  Prior Outpatient Therapy Prior Outpatient Therapy: No Does patient have an ACCT team?: No Does patient have Intensive In-House Services?  : No Does patient have [facility] services? : No Does patient have P4CC services?: No  ADL Screening (condition at time of admission) [name] cognitive ability adequate to safely complete daily activities?: Yes Patient able to express need for assistance with ADLs?: Yes Independently performs ADLs?: Yes (appropriate for developmental age)       Abuse/Neglect Assessment (Assessment to be complete while patient is alone) Physical Abuse: Denies Verbal Abuse: Denies Sexual Abuse: Denies Exploitation of  patient/patient's resources: Denies Self-Neglect: Denies Values / Beliefs Cultural Requests During Hospitalization: None Consults Spiritual Care Consult Needed: No Social Work Consult Needed: No Merchant navy officer (For Healthcare) Does patient have an advance directive?: No    Additional Information 1:1 In Past 12 Months?: No CIRT Risk: No Elopement Risk: No Does patient have medical clearance?: Yes     Disposition:  Disposition Initial Assessment Completed for this Encounter: Yes Disposition of Patient: Other dispositions (Re-evaluate in AM) Other disposition(s): Other (Comment) (Re-evaluate in AM)  On Site Evaluation by:   Reviewed with Physician:    Maryelizabeth Rowan A 12/12/2015 2:24 PM

## 2015-12-12 NOTE — ED Notes (Signed)
tts at bedside 

## 2015-12-12 NOTE — ED Notes (Signed)
Pt bib police and daughter.  Pt comes from home and is taken care of by daughter.  Today pt is IVC'd.  Per daughter, pt attempted to run away from home.  Pt hx of dementia and has not been taking her medication.  Family reports that when pt does not take medications, pt gets confused and agitated.  Pt denies SI/HI/ AVH. Pt repeatedly states she is "upset" in triage.

## 2015-12-12 NOTE — ED Notes (Signed)
Bed: ZO10 Expected date:  Expected time:  Means of arrival:  Comments: Hold for Triage 5

## 2015-12-13 ENCOUNTER — Emergency Department (HOSPITAL_COMMUNITY): Payer: Medicare PPO

## 2015-12-13 DIAGNOSIS — Z01818 Encounter for other preprocedural examination: Secondary | ICD-10-CM | POA: Diagnosis not present

## 2015-12-13 DIAGNOSIS — R454 Irritability and anger: Secondary | ICD-10-CM | POA: Diagnosis not present

## 2015-12-13 DIAGNOSIS — F0391 Unspecified dementia with behavioral disturbance: Secondary | ICD-10-CM | POA: Diagnosis not present

## 2015-12-13 NOTE — ED Notes (Signed)
Pt up making bed states that she is not crazy and that she is just wanting to go to church. Denies wanting to hurt herself or anyone else. Does not want to be here would like to go back with her family.

## 2015-12-13 NOTE — Progress Notes (Signed)
Disposition CSW completed referrals to the following Gero[facility]Pysch facilities:  [facility]. Tory Emerald  CSW will follow patient for placement needs.  [facility] Disposition CSW [phone]

## 2015-12-13 NOTE — Consult Note (Signed)
[facility] Face-to-Face Psychiatry Consult   Reason for Consult:  Agitation, aggression. Referring Physician:  EDP Patient Identification: Michele Cervantes MRN:  [id number] Principal Diagnosis: Dementia with behavioral disturbance Diagnosis:   Patient Active Problem List   Diagnosis Date Noted  . Dementia with behavioral disturbance [F03.91] 05/26/2015    Priority: High    Total Time spent with patient: 45 minutes  Subjective:   Michele Cervantes is a 82 y.o. female patient admitted with agitation, aggression  HPI:  AA female, 82 years old was evaluated for agitation and aggression.   Patient is unable to answer questions promptly and correctly.  She reports that she came to the hospital to see her mother and also stated that all of her life she has been taking care of her people.  Her speech is tangential and disorganized.  She was IVC by her daughter and stated that her family members don't like her and brought her to the hospital.  Patient states that she tries to mind her business and read her Bible.  She reports poor sleep because she is busy reading her Bible.   She denies SI/HI/AVH.  Patient has been accepted for admission and we will be seeking placement at a gero-psychiatry unit with available bed.     Past Psychiatric History:  Unknown  Risk to Self: Suicidal Ideation: No-Not Currently/Within Last 6 Months Suicidal Intent: No-Not Currently/Within Last 6 Months Is patient at risk for suicide?: No Suicidal Plan?: No-Not Currently/Within Last 6 Months Access to Means: No What has been your use of drugs/alcohol within the last 12 months?: na How many times?: 0 Intentional Self Injurious Behavior: None Risk to Others: Homicidal Ideation: No-Not Currently/Within Last 6 Months Thoughts of Harm to Others: No-Not Currently Present/Within Last 6 Months Current Homicidal Intent: No-Not Currently/Within Last 6 Months Current Homicidal Plan: No-Not Currently/Within Last 6 Months Access to  Homicidal Means: No Identified Victim: na History of harm to others?: No Assessment of Violence: None Noted Violent Behavior Description: verbal aggression toward adult children Does patient have access to weapons?: No Criminal Charges Pending?: No Does patient have a court date: No Prior Inpatient Therapy: Prior Inpatient Therapy: No Prior Outpatient Therapy: Prior Outpatient Therapy: No Does patient have an ACCT team?: No Does patient have Intensive In-House Services?  : No Does patient have [facility] services? : No Does patient have P4CC services?: No  Past Medical History:  Past Medical History  Diagnosis Date  . Alzheimer disease   . Anginal pain (HCC)   . Hypercholesteremia   . Hypertension   . CHF (congestive heart failure) (HCC)     Past Surgical History  Procedure Laterality Date  . Abdominal hysterectomy    . Thyroid lobectomy     Family History:  Family History  Problem Relation Age of Onset  . CAD Mother   . Heart disease Mother   . Breast cancer Mother   . Dementia Mother   . Alcohol abuse Father   . Cancer Brother   . Dementia Brother    Family Psychiatric  History:  Unable to obtain Social History:  History  Alcohol Use No     History  Drug Use No    Social History   Social History  . Marital Status: Widowed    Spouse Name: N/A  . Number of Children: 4  . Years of Education: some coll.   Occupational History  . retired    Social History Main Topics  . Smoking status: Never Smoker   .   Smokeless tobacco: Never Used  . Alcohol Use: No  . Drug Use: No  . Sexual Activity: Not Asked   Other Topics Concern  . None   Social History Narrative   Patient does not drink caffeine.   Patient is right handed.   Additional Social History:    Pain Medications: see chart Prescriptions: see chart Over the Counter: see chart History of alcohol / drug use?: No history of alcohol / drug abuse      Allergies:  No Known Allergies  Labs:   Results for orders placed or performed during the hospital encounter of 12/12/15 (from the past 48 hour(s))  Comprehensive metabolic panel     Status: Abnormal   Collection Time: 12/12/15  2:09 PM  Result Value Ref Range   Sodium 142 135 - 145 mmol/L   Potassium 3.4 (L) 3.5 - 5.1 mmol/L   Chloride 107 101 - 111 mmol/L   CO2 27 22 - 32 mmol/L   Glucose, Bld 89 65 - 99 mg/dL   BUN 10 6 - 20 mg/dL   Creatinine, Ser 0.79 0.44 - 1.00 mg/dL   Calcium 9.1 8.9 - 10.3 mg/dL   Total Protein 7.5 6.5 - 8.1 g/dL   Albumin 3.7 3.5 - 5.0 g/dL   AST 17 15 - 41 U/L   ALT 12 (L) 14 - 54 U/L   Alkaline Phosphatase 93 38 - 126 U/L   Total Bilirubin 0.9 0.3 - 1.2 mg/dL   GFR calc non Af Amer >60 >60 mL/min   GFR calc Af Amer >60 >60 mL/min    Comment: (NOTE) The eGFR has been calculated using the CKD EPI equation. This calculation has not been validated in all clinical situations. eGFR's persistently <60 mL/min signify possible Chronic Kidney Disease.    Anion gap 8 5 - 15  Ethanol     Status: None   Collection Time: 12/12/15  2:09 PM  Result Value Ref Range   Alcohol, Ethyl (B) <5 <5 mg/dL    Comment:        LOWEST DETECTABLE LIMIT FOR SERUM ALCOHOL IS 5 mg/dL FOR MEDICAL PURPOSES ONLY   CBC with Diff     Status: None   Collection Time: 12/12/15  2:09 PM  Result Value Ref Range   WBC 6.0 4.0 - 10.5 K/uL   RBC 4.36 3.87 - 5.11 MIL/uL   Hemoglobin 12.3 12.0 - 15.0 g/dL   HCT 38.7 36.0 - 46.0 %   MCV 88.8 78.0 - 100.0 fL   MCH 28.2 26.0 - 34.0 pg   MCHC 31.8 30.0 - 36.0 g/dL   RDW 13.6 11.5 - 15.5 %   Platelets 271 150 - 400 K/uL   Neutrophils Relative % 60 %   Neutro Abs 3.7 1.7 - 7.7 K/uL   Lymphocytes Relative 28 %   Lymphs Abs 1.7 0.7 - 4.0 K/uL   Monocytes Relative 10 %   Monocytes Absolute 0.6 0.1 - 1.0 K/uL   Eosinophils Relative 1 %   Eosinophils Absolute 0.1 0.0 - 0.7 K/uL   Basophils Relative 1 %   Basophils Absolute 0.0 0.0 - 0.1 K/uL  Salicylate level     Status: None    Collection Time: 12/12/15  2:09 PM  Result Value Ref Range   Salicylate Lvl <4.0 2.8 - 30.0 mg/dL  Acetaminophen level     Status: Abnormal   Collection Time: 12/12/15  2:09 PM  Result Value Ref Range   Acetaminophen (Tylenol), Serum <10 (L)   10 - 30 ug/mL    Comment:        THERAPEUTIC CONCENTRATIONS VARY SIGNIFICANTLY. A RANGE OF 10-30 ug/mL MAY BE AN EFFECTIVE CONCENTRATION FOR MANY PATIENTS. HOWEVER, SOME ARE BEST TREATED AT CONCENTRATIONS OUTSIDE THIS RANGE. ACETAMINOPHEN CONCENTRATIONS >150 ug/mL AT 4 HOURS AFTER INGESTION AND >50 ug/mL AT 12 HOURS AFTER INGESTION ARE OFTEN ASSOCIATED WITH TOXIC REACTIONS.   Urine rapid drug screen (hosp performed)not at [facility]     Status: None   Collection Time: 12/12/15  2:48 PM  Result Value Ref Range   Opiates NONE DETECTED NONE DETECTED   Cocaine NONE DETECTED NONE DETECTED   Benzodiazepines NONE DETECTED NONE DETECTED   Amphetamines NONE DETECTED NONE DETECTED   Tetrahydrocannabinol NONE DETECTED NONE DETECTED   Barbiturates NONE DETECTED NONE DETECTED    Comment:        DRUG SCREEN FOR MEDICAL PURPOSES ONLY.  IF CONFIRMATION IS NEEDED FOR ANY PURPOSE, NOTIFY LAB WITHIN 5 DAYS.        LOWEST DETECTABLE LIMITS FOR URINE DRUG SCREEN Drug Class       Cutoff (ng/mL) Amphetamine      1000 Barbiturate      200 Benzodiazepine   200 Tricyclics       300 Opiates          300 Cocaine          300 THC              50   Urinalysis, Routine w reflex microscopic (not at [facility])     Status: Abnormal   Collection Time: 12/12/15  2:48 PM  Result Value Ref Range   Color, Urine YELLOW YELLOW   APPearance CLOUDY (A) CLEAR   Specific Gravity, Urine 1.027 1.005 - 1.030   pH 5.5 5.0 - 8.0   Glucose, UA NEGATIVE NEGATIVE mg/dL   Hgb urine dipstick NEGATIVE NEGATIVE   Bilirubin Urine SMALL (A) NEGATIVE   Ketones, ur NEGATIVE NEGATIVE mg/dL   Protein, ur NEGATIVE NEGATIVE mg/dL   Nitrite NEGATIVE NEGATIVE   Leukocytes, UA SMALL (A)  NEGATIVE  Urine microscopic-add on     Status: Abnormal   Collection Time: 12/12/15  2:48 PM  Result Value Ref Range   Squamous Epithelial / LPF 6-30 (A) NONE SEEN   WBC, UA 0-5 0 - 5 WBC/hpf   RBC / HPF NONE SEEN 0 - 5 RBC/hpf   Bacteria, UA MANY (A) NONE SEEN   Urine-Other MUCOUS PRESENT     Current Facility-Administered Medications  Medication Dose Route Frequency Provider Last Rate Last Dose  . acetaminophen (TYLENOL) tablet 650 mg  650 mg Oral Q4H PRN Mercedes Camprubi-Soms, PA-C      . alum & mag hydroxide-simeth (MAALOX/MYLANTA) 200-200-20 MG/5ML suspension 30 mL  30 mL Oral PRN Mercedes Camprubi-Soms, PA-C      . aspirin EC tablet 81 mg  81 mg Oral Daily Mercedes Camprubi-Soms, PA-C   81 mg at 12/13/15 1040  . cephALEXin (KEFLEX) capsule 500 mg  500 mg Oral Q12H Mercedes Camprubi-Soms, PA-C   500 mg at 12/13/15 1040  . donepezil (ARICEPT) tablet 10 mg  10 mg Oral QHS Mercedes Camprubi-Soms, PA-C   10 mg at 12/12/15 2129  . ibuprofen (ADVIL,MOTRIN) tablet 600 mg  600 mg Oral Q8H PRN Mercedes Camprubi-Soms, PA-C      . ondansetron (ZOFRAN) tablet 4 mg  4 mg Oral Q8H PRN Mercedes Camprubi-Soms, PA-C      . pravastatin (PRAVACHOL) tablet 10 mg  10   mg Oral QHS Mercedes Camprubi-Soms, PA-C   10 mg at 12/12/15 2200  . risperiDONE (RISPERDAL) tablet 0.25 mg  0.25 mg Oral QHS Shannia Jacuinde   0.25 mg at 12/12/15 2129  . sertraline (ZOLOFT) tablet 100 mg  100 mg Oral Daily Mercedes Camprubi-Soms, PA-C   100 mg at 12/13/15 1040  . traZODone (DESYREL) tablet 100 mg  100 mg Oral QHS Mercedes Camprubi-Soms, PA-C   100 mg at 12/12/15 2130   Current Outpatient Prescriptions  Medication Sig Dispense Refill  . aspirin 81 MG tablet Take 81 mg by mouth daily.    Marland Kitchen donepezil (ARICEPT) 10 MG tablet Take 10 mg by mouth at bedtime.    Marland Kitchen guaiFENesin (ROBITUSSIN) 100 MG/5ML liquid Take 5-10 mLs (100-200 mg total) by mouth every 4 (four) hours as needed for cough. 60 mL 0  . pravastatin (PRAVACHOL) 10 MG  tablet Take 10 mg by mouth at bedtime.   1  . risperiDONE (RISPERDAL) 0.25 MG tablet Take 0.25 mg by mouth at bedtime.   3  . sertraline (ZOLOFT) 50 MG tablet Take 2 tablets (100 mg total) by mouth daily. 60 tablet 5  . traZODone (DESYREL) 100 MG tablet Take 100 mg by mouth at bedtime.     . cephALEXin (KEFLEX) 500 MG capsule Take 1 capsule (500 mg total) by mouth 2 (two) times daily. x 7 days total (including the days that you were in the hospital) 14 capsule 0    Musculoskeletal: Strength & Muscle Tone: within normal limits Gait & Station: normal Patient leans: N/A  Psychiatric Specialty Exam: Review of Systems  Unable to perform ROS: mental acuity    Blood pressure 143/50, pulse 63, temperature 97.8 F (36.6 C), temperature source Oral, resp. rate 18, SpO2 96 %.There is no weight on file to calculate BMI.  General Appearance: Casual and Fairly Groomed  Engineer, water::  Fair  Speech:  Clear and Coherent and disorganized  Volume:  Normal  Mood:  Angry  Affect:  Congruent  Thought Process:  Circumstantial, Disorganized and Tangential  Orientation:  Other:  disoriented to time, place and person  Thought Content:  Paranoid Ideation  Suicidal Thoughts:  No  Homicidal Thoughts:  No  Memory:  Immediate;   Poor Recent;   Poor Remote;   Poor  Judgement:  Poor  Insight:  Lacking  Psychomotor Activity:  Normal  Concentration:  Fair  Recall:  Poor  Fund of Knowledge:Poor  Language: Fair  Akathisia:  No  Handed:  Right  AIMS (if indicated):     Assets:  Others:  unable to obtain but want to go home.  ADL's:  Intact  Cognition: Impaired,  Severe  Sleep:      Treatment Plan Summary: Daily contact with patient to assess and evaluate symptoms and progress in treatment and Medication management  Disposition: Accepted for admission and we will seek placement at a gero-psychiatric facility.   We will continue her home medications.  We are giving oral antibiotics for UTI.  Delfin Gant   PMHNP-BC 12/13/2015 12:42 PM Patient seen face-to-face for psychiatric evaluation, chart reviewed and case discussed with the physician extender and developed treatment plan. Reviewed the information documented and agree with the treatment plan. Corena Pilgrim, MD

## 2015-12-13 NOTE — ED Notes (Signed)
Patient sts that she wants to move in with her daughter, Dois Davenport

## 2015-12-13 NOTE — ED Notes (Signed)
Peggy from [facility] called, they are accepting her, sent over IVC paperwork [phone] call for report

## 2015-12-13 NOTE — ED Notes (Signed)
Patient standing at sink, wiping sink with wet washcloth, respirations even and unlabored, skin warm and dry, denies needs, will continue to monitor, sitter remains at bedside.

## 2015-12-13 NOTE — ED Notes (Signed)
Awaiting Michele Cervantes from Michele Cervantes to call me back to ok sending ms. Jean Rosenthal

## 2015-12-14 LAB — URINE CULTURE: Culture: 8000

## 2015-12-14 NOTE — ED Notes (Signed)
Sheriff at bedside for transport to [place].

## 2015-12-14 NOTE — ED Notes (Signed)
Sheriff report transport will arrive in 30 minutes; Nicholos Johns from [facility] updated.

## 2015-12-14 NOTE — BH Assessment (Signed)
[facility] Assessment Progress Note  Per Nicholos Johns at [facility], pt has been accepted to their facility by Cathie Hoops, MD.  Thedore Mins, MD concurs with this decision.  Pt is under IVC and is to be transported via [facility].  Pt's nurse has been notified and agrees to call report to [phone].  Doylene Canning, MA Triage Specialist [phone]

## 2015-12-14 NOTE — ED Notes (Signed)
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

## 2015-12-14 NOTE — ED Notes (Signed)
Transportation called and reports that they should be in .

## 2015-12-14 NOTE — ED Notes (Signed)
TTS at bedside. 

## 2015-12-26 ENCOUNTER — Inpatient Hospital Stay (HOSPITAL_COMMUNITY)
Admission: EM | Admit: 2015-12-26 | Discharge: 2015-12-30 | DRG: 177 | Disposition: A | Discharge status: Skilled Nursing Facility | Payer: Medicare PPO | Attending: Internal Medicine | Admitting: Internal Medicine

## 2015-12-26 ENCOUNTER — Encounter (HOSPITAL_COMMUNITY): Payer: Self-pay | Admitting: Emergency Medicine

## 2015-12-26 ENCOUNTER — Emergency Department (HOSPITAL_COMMUNITY): Payer: Medicare PPO

## 2015-12-26 DIAGNOSIS — I10 Essential (primary) hypertension: Secondary | ICD-10-CM | POA: Diagnosis present

## 2015-12-26 DIAGNOSIS — E7800 Pure hypercholesterolemia, unspecified: Secondary | ICD-10-CM | POA: Diagnosis present

## 2015-12-26 DIAGNOSIS — F0391 Unspecified dementia with behavioral disturbance: Secondary | ICD-10-CM | POA: Diagnosis not present

## 2015-12-26 DIAGNOSIS — F03918 Unspecified dementia, unspecified severity, with other behavioral disturbance: Secondary | ICD-10-CM | POA: Diagnosis present

## 2015-12-26 DIAGNOSIS — G9340 Encephalopathy, unspecified: Secondary | ICD-10-CM | POA: Diagnosis present

## 2015-12-26 DIAGNOSIS — D72819 Decreased white blood cell count, unspecified: Secondary | ICD-10-CM | POA: Diagnosis present

## 2015-12-26 DIAGNOSIS — Z79899 Other long term (current) drug therapy: Secondary | ICD-10-CM

## 2015-12-26 DIAGNOSIS — R627 Adult failure to thrive: Secondary | ICD-10-CM | POA: Diagnosis present

## 2015-12-26 DIAGNOSIS — Z8249 Family history of ischemic heart disease and other diseases of the circulatory system: Secondary | ICD-10-CM

## 2015-12-26 DIAGNOSIS — Z7401 Bed confinement status: Secondary | ICD-10-CM

## 2015-12-26 DIAGNOSIS — J690 Pneumonitis due to inhalation of food and vomit: Principal | ICD-10-CM | POA: Diagnosis present

## 2015-12-26 DIAGNOSIS — J189 Pneumonia, unspecified organism: Secondary | ICD-10-CM | POA: Diagnosis present

## 2015-12-26 DIAGNOSIS — Z7189 Other specified counseling: Secondary | ICD-10-CM | POA: Insufficient documentation

## 2015-12-26 DIAGNOSIS — I517 Cardiomegaly: Secondary | ICD-10-CM | POA: Diagnosis not present

## 2015-12-26 DIAGNOSIS — J181 Lobar pneumonia, unspecified organism: Secondary | ICD-10-CM

## 2015-12-26 DIAGNOSIS — Z811 Family history of alcohol abuse and dependence: Secondary | ICD-10-CM

## 2015-12-26 DIAGNOSIS — G309 Alzheimer's disease, unspecified: Secondary | ICD-10-CM | POA: Diagnosis present

## 2015-12-26 DIAGNOSIS — I248 Other forms of acute ischemic heart disease: Secondary | ICD-10-CM | POA: Diagnosis present

## 2015-12-26 DIAGNOSIS — F0281 Dementia in other diseases classified elsewhere with behavioral disturbance: Secondary | ICD-10-CM | POA: Diagnosis present

## 2015-12-26 DIAGNOSIS — Z7982 Long term (current) use of aspirin: Secondary | ICD-10-CM

## 2015-12-26 DIAGNOSIS — E785 Hyperlipidemia, unspecified: Secondary | ICD-10-CM | POA: Diagnosis present

## 2015-12-26 DIAGNOSIS — R0989 Other specified symptoms and signs involving the circulatory and respiratory systems: Secondary | ICD-10-CM | POA: Diagnosis not present

## 2015-12-26 DIAGNOSIS — R4182 Altered mental status, unspecified: Secondary | ICD-10-CM | POA: Diagnosis not present

## 2015-12-26 DIAGNOSIS — I5033 Acute on chronic diastolic (congestive) heart failure: Secondary | ICD-10-CM | POA: Diagnosis present

## 2015-12-26 DIAGNOSIS — E860 Dehydration: Secondary | ICD-10-CM | POA: Diagnosis present

## 2015-12-26 DIAGNOSIS — Z515 Encounter for palliative care: Secondary | ICD-10-CM | POA: Insufficient documentation

## 2015-12-26 DIAGNOSIS — R0902 Hypoxemia: Secondary | ICD-10-CM | POA: Diagnosis present

## 2015-12-26 DIAGNOSIS — I110 Hypertensive heart disease with heart failure: Secondary | ICD-10-CM | POA: Diagnosis present

## 2015-12-26 DIAGNOSIS — Z803 Family history of malignant neoplasm of breast: Secondary | ICD-10-CM

## 2015-12-26 LAB — RAPID URINE DRUG SCREEN, HOSP PERFORMED
AMPHETAMINES: NOT DETECTED
BENZODIAZEPINES: NOT DETECTED
Barbiturates: NOT DETECTED
Cocaine: NOT DETECTED
OPIATES: NOT DETECTED
Tetrahydrocannabinol: NOT DETECTED

## 2015-12-26 LAB — CBC WITH DIFFERENTIAL/PLATELET
BASOS PCT: 0 %
Basophils Absolute: 0 10*3/uL (ref 0.0–0.1)
EOS ABS: 0 10*3/uL (ref 0.0–0.7)
Eosinophils Relative: 0 %
HCT: 35.8 % — ABNORMAL LOW (ref 36.0–46.0)
HEMOGLOBIN: 11.4 g/dL — AB (ref 12.0–15.0)
Lymphocytes Relative: 13 %
Lymphs Abs: 0.8 10*3/uL (ref 0.7–4.0)
MCH: 28.6 pg (ref 26.0–34.0)
MCHC: 31.8 g/dL (ref 30.0–36.0)
MCV: 89.7 fL (ref 78.0–100.0)
Monocytes Absolute: 0.5 10*3/uL (ref 0.1–1.0)
Monocytes Relative: 7 %
NEUTROS PCT: 80 %
Neutro Abs: 5 10*3/uL (ref 1.7–7.7)
Platelets: 269 10*3/uL (ref 150–400)
RBC: 3.99 MIL/uL (ref 3.87–5.11)
RDW: 14.7 % (ref 11.5–15.5)
WBC: 6.3 10*3/uL (ref 4.0–10.5)

## 2015-12-26 LAB — COMPREHENSIVE METABOLIC PANEL
ALT: 41 U/L (ref 14–54)
AST: 51 U/L — ABNORMAL HIGH (ref 15–41)
Albumin: 3.2 g/dL — ABNORMAL LOW (ref 3.5–5.0)
Alkaline Phosphatase: 88 U/L (ref 38–126)
Anion gap: 12 (ref 5–15)
BUN: 12 mg/dL (ref 6–20)
CALCIUM: 8.5 mg/dL — AB (ref 8.9–10.3)
CHLORIDE: 100 mmol/L — AB (ref 101–111)
CO2: 25 mmol/L (ref 22–32)
CREATININE: 0.86 mg/dL (ref 0.44–1.00)
GFR, EST NON AFRICAN AMERICAN: 60 mL/min — AB (ref >60)
Glucose, Bld: 120 mg/dL — ABNORMAL HIGH (ref 65–99)
Potassium: 4.2 mmol/L (ref 3.5–5.1)
Sodium: 137 mmol/L (ref 135–145)
Total Bilirubin: 0.4 mg/dL (ref 0.3–1.2)
Total Protein: 6.8 g/dL (ref 6.5–8.1)

## 2015-12-26 LAB — AMMONIA: AMMONIA: 26 umol/L (ref 9–35)

## 2015-12-26 LAB — URINALYSIS, ROUTINE W REFLEX MICROSCOPIC
BILIRUBIN URINE: NEGATIVE
Glucose, UA: NEGATIVE mg/dL
Hgb urine dipstick: NEGATIVE
KETONES UR: NEGATIVE mg/dL
Leukocytes, UA: NEGATIVE
NITRITE: NEGATIVE
PROTEIN: NEGATIVE mg/dL
Specific Gravity, Urine: 1.026 (ref 1.005–1.030)
pH: 6 (ref 5.0–8.0)

## 2015-12-26 LAB — VALPROIC ACID LEVEL: Valproic Acid Lvl: 59 ug/mL (ref 50.0–100.0)

## 2015-12-26 LAB — BRAIN NATRIURETIC PEPTIDE: B NATRIURETIC PEPTIDE 5: 42.5 pg/mL (ref 0.0–100.0)

## 2015-12-26 LAB — ETHANOL

## 2015-12-26 LAB — TROPONIN I: Troponin I: 0.03 ng/mL (ref <0.031)

## 2015-12-26 NOTE — H&P (Signed)
PCP:  Michele Headings, MD  Neurology [facility]  Referring provider [facility]   Chief Complaint: confusion  HPI: [facility] is a 82 y.o. female   has a past medical history of Alzheimer disease; Anginal pain (HCC); Hypercholesteremia; Hypertension; and CHF (congestive heart failure) (HCC).   Presented with somnolence and confusion. [facility] to live with her family at home. Patient has been too tired to get out of the bed. She is not eating and drinking sleeping all day has become incontinent urine. All day today she was leaning to the left. Family felt the decreased in alerteness was a sudden thing. Last time she was seen normal was on the 3rd of February. Family reports some cough, no fever.   Family brought her to emergency department  IN ER: CT scan of the head unremarkable for any acute abnormalities ABG showed evidence of hypercarbia. Ammonia level within normal limits Chest x-ray showed new left lower lobe infiltrate with left pleural effusion and cardiomegaly  Regarding pertinent past history: History of dementia with agitation and aggression in January patient was IVC'ed by  her daughter she's been having poor sleep. [facility] where her medications have been adjusted. Apparently she was discharged to home to the care of her family yesterday.  She apparently has history of CHF but no echogram on file.  Hospitalist was called for admission for left lower lobe pneumonia worrisome for aspiration pneumonia  Review of Systems:    Pertinent positives include: increased somnolence, chills, cough. tremor  Constitutional:  No weight loss, night sweats, Fevers, chills, fatigue, weight loss  HEENT:  No headaches, Difficulty swallowing,Tooth/dental problems,Sore throat,  No sneezing, itching, ear ache, nasal congestion, post nasal drip,  Cardio-vascular:  No chest pain,  Orthopnea, PND, anasarca, dizziness, palpitations.no Bilateral lower extremity swelling  GI:  No heartburn, indigestion, abdominal pain, nausea, vomiting, diarrhea, change in bowel habits, loss of appetite, melena, blood in stool, hematemesis Resp:  no shortness of breath at rest. No dyspnea on exertion, No excess mucus, no productive cough, No non-productive cough, No coughing up of blood.No change in color of mucus.No wheezing. Skin:  no rash or lesions. No jaundice GU:  no dysuria, change in color of urine, no urgency or frequency. No straining to urinate.  No flank pain.  Musculoskeletal:  No joint pain or no joint swelling. No decreased range of motion. No back pain.  Psych:  No change in mood or affect. No depression or anxiety. No memory loss.  Neuro: no localizing neurological complaints, no tingling, no weakness, no double vision, no gait abnormality, no slurred speech, no confusion  Otherwise ROS are negative except for above, 10 systems were reviewed  Past Medical History: Past Medical History  Diagnosis Date  . Alzheimer disease   . Anginal pain (HCC)   . Hypercholesteremia   . Hypertension   . CHF (congestive heart failure) [facility])    Past Surgical History  Procedure Laterality Date  . Abdominal hysterectomy    . Thyroid lobectomy       Medications: Prior to Admission medications   Medication Sig Start Date End Date Taking? Authorizing Provider  aspirin 81 MG tablet Take 81 mg by mouth daily.   Yes Historical Provider, MD  carvedilol (COREG) 6.25 MG tablet Take 6.25 mg by mouth 2 (two) times daily. 12/24/15  Yes Historical Provider, MD  divalproex (DEPAKOTE) 250 MG DR tablet Take 250 mg by mouth  2 (two) times daily. 12/24/15 12/23/16 Yes Historical Provider, MD  donepezil (ARICEPT) 10 MG tablet Take 10 mg by mouth at bedtime. 10/10/15  Yes Historical Provider, MD  guaiFENesin (ROBITUSSIN) 100 MG/5ML liquid Take 5-10 mLs (100-200 mg total) by mouth every 4 (four) hours as  needed for cough. 11/24/15  Yes Michele Barrette, MD  LORazepam (ATIVAN) 0.5 MG tablet Take 0.5 mg by mouth every 6 (six) hours as needed for anxiety or sleep.  12/24/15 01/03/16 Yes Historical Provider, MD  mirtazapine (REMERON) 30 MG tablet Take 30 mg by mouth at bedtime. 12/24/15 01/23/16 Yes Historical Provider, MD  pravastatin (PRAVACHOL) 10 MG tablet Take 10 mg by mouth at bedtime.  02/21/15  Yes Historical Provider, MD  risperiDONE (RISPERDAL) 1 MG tablet Take 1 mg by mouth at bedtime.  12/24/15  Yes Historical Provider, MD  traZODone (DESYREL) 100 MG tablet Take 100 mg by mouth at bedtime.  12/10/15  Yes Historical Provider, MD  cephALEXin (KEFLEX) 500 MG capsule Take 1 capsule (500 mg total) by mouth 2 (two) times daily. x 7 days total (including the days that you were in the [facility]) Patient not taking: Reported on 12/26/2015 12/12/15   Michele Camprubi-Soms, PA-C  sertraline (ZOLOFT) 50 MG tablet Take 2 tablets (100 mg total) by mouth daily. Patient not taking: Reported on 12/26/2015 10/20/15   Michele Spaniel, MD    Allergies:  No Known Allergies  Social History:  Ambulatory  bed bound Lives at home   With family     reports that she has never smoked. She has never used smokeless tobacco. She reports that she does not drink alcohol or use illicit drugs.     Family History: family history includes Alcohol abuse in her father; Breast cancer in her mother; CAD in her mother; Cancer in her brother; Dementia in her brother and mother; Heart disease in her mother.    Physical Exam: Patient Vitals for the past 24 hrs:  BP Temp Temp src Pulse Resp SpO2  12/26/15 2212 (!) 131/46 mmHg - - 75 24 95 %  12/26/15 2015 135/65 mmHg 98.6 F (37 C) Oral 91 (!) 30 90 %    1. General:  in No Acute distress 2. Psychological: somnolent but to be aroused and followed commands not Oriented 3. Head/ENT:    Dry Mucous Membranes                          Head Non traumatic, neck supple                             Poor Dentition 4. SKIN:   decreased Skin turgor,  Skin clean Dry and intact no rash 5. Heart: Regular rate and rhythm no Murmur, Rub or gallop 6. Lungs:  no wheezes some crackles   7. Abdomen: Soft, non-tender, Non distended 8. Lower extremities: no clubbing, cyanosis, or edema 9. Neurologically Grossly intact, moving all 4 extremities equally grips equal bilaterally 10. MSK: Normal range of motion  body mass index is unknown because there is no weight on file.   Labs on Admission:   Results for orders placed or performed during the [facility] encounter of 12/26/15 (from the past 24 hour(s))  Comprehensive metabolic panel     Status: Abnormal   Collection Time: 12/26/15  9:12 PM  Result Value Ref Range   Sodium 137 135 - 145 mmol/L   Potassium  4.2 3.5 - 5.1 mmol/L   Chloride 100 (L) 101 - 111 mmol/L   CO2 25 22 - 32 mmol/L   Glucose, Bld 120 (H) 65 - 99 mg/dL   BUN 12 6 - 20 mg/dL   Creatinine, Ser 8.11 0.44 - 1.00 mg/dL   Calcium 8.5 (L) 8.9 - 10.3 mg/dL   Total Protein 6.8 6.5 - 8.1 g/dL   Albumin 3.2 (L) 3.5 - 5.0 g/dL   AST 51 (H) 15 - 41 U/L   ALT 41 14 - 54 U/L   Alkaline Phosphatase 88 38 - 126 U/L   Total Bilirubin 0.4 0.3 - 1.2 mg/dL   GFR calc non Af Amer 60 (L) >60 mL/min   GFR calc Af Amer >60 >60 mL/min   Anion gap 12 5 - 15  CBC with Differential     Status: Abnormal   Collection Time: 12/26/15  9:12 PM  Result Value Ref Range   WBC 6.3 4.0 - 10.5 K/uL   RBC 3.99 3.87 - 5.11 MIL/uL   Hemoglobin 11.4 (L) 12.0 - 15.0 g/dL   HCT 91.4 (L) 78.2 - 95.6 %   MCV 89.7 78.0 - 100.0 fL   MCH 28.6 26.0 - 34.0 pg   MCHC 31.8 30.0 - 36.0 g/dL   RDW 21.3 08.6 - 57.8 %   Platelets 269 150 - 400 K/uL   Neutrophils Relative % 80 %   Neutro Abs 5.0 1.7 - 7.7 K/uL   Lymphocytes Relative 13 %   Lymphs Abs 0.8 0.7 - 4.0 K/uL   Monocytes Relative 7 %   Monocytes Absolute 0.5 0.1 - 1.0 K/uL   Eosinophils Relative 0 %   Eosinophils Absolute 0.0 0.0 - 0.7 K/uL   Basophils  Relative 0 %   Basophils Absolute 0.0 0.0 - 0.1 K/uL  Ethanol     Status: None   Collection Time: 12/26/15  9:12 PM  Result Value Ref Range   Alcohol, Ethyl (B) <5 <5 mg/dL  Valproic acid level     Status: None   Collection Time: 12/26/15  9:12 PM  Result Value Ref Range   Valproic Acid Lvl 59 50.0 - 100.0 ug/mL  Ammonia     Status: None   Collection Time: 12/26/15  9:12 PM  Result Value Ref Range   Ammonia 26 9 - 35 umol/L  Troponin I     Status: None   Collection Time: 12/26/15  9:12 PM  Result Value Ref Range   Troponin I 0.03 <0.031 ng/mL  Brain natriuretic peptide     Status: None   Collection Time: 12/26/15  9:12 PM  Result Value Ref Range   B Natriuretic Peptide 42.5 0.0 - 100.0 pg/mL  Urinalysis, Routine w reflex microscopic     Status: Abnormal   Collection Time: 12/26/15 10:12 PM  Result Value Ref Range   Color, Urine AMBER (A) YELLOW   APPearance CLEAR CLEAR   Specific Gravity, Urine 1.026 1.005 - 1.030   pH 6.0 5.0 - 8.0   Glucose, UA NEGATIVE NEGATIVE mg/dL   Hgb urine dipstick NEGATIVE NEGATIVE   Bilirubin Urine NEGATIVE NEGATIVE   Ketones, ur NEGATIVE NEGATIVE mg/dL   Protein, ur NEGATIVE NEGATIVE mg/dL   Nitrite NEGATIVE NEGATIVE   Leukocytes, UA NEGATIVE NEGATIVE  Urine rapid drug screen (hosp performed)     Status: None   Collection Time: 12/26/15 10:12 PM  Result Value Ref Range   Opiates NONE DETECTED NONE DETECTED   Cocaine NONE DETECTED  NONE DETECTED   Benzodiazepines NONE DETECTED NONE DETECTED   Amphetamines NONE DETECTED NONE DETECTED   Tetrahydrocannabinol NONE DETECTED NONE DETECTED   Barbiturates NONE DETECTED NONE DETECTED    UA  no evidence of UTI  No results found for: HGBA1C  CrCl cannot be calculated (Unknown ideal weight.).  BNP (last 3 results) No results for input(s): PROBNP in the last 8760 hours.  Other results:  I have pearsonaly reviewed this: ECG REPORT not obtained  There were no vitals filed for this  visit.   Cultures:    Component Value Date/Time   SDES URINE, RANDOM 12/12/2015 1448   SPECREQUEST NONE 12/12/2015 1448   CULT  12/12/2015 1448    8,000 COLONIES/mL INSIGNIFICANT GROWTH Performed at [facility]    REPTSTATUS 12/14/2015 FINAL 12/12/2015 1448     Radiological Exams on Admission: Dg Chest Portable 1 View  12/26/2015  CLINICAL DATA:  [name] family member states sudden onset of weakness today. Pt asleep during entire x-ray exam. Family reports patient was discharged from [facility] 2 days ago and started on Risperidone. Hx of Alzheimer, HTN, CHF. EXAM: PORTABLE CHEST 1 VIEW COMPARISON:  12/13/2015 FINDINGS: Shallow lung inflation. The heart is mildly enlarged. There is patchy density at the left lung base representing a change over prior study. Left pleural effusion is present. There is mild pulmonary vascular congestion but no overt edema. IMPRESSION: 1. New left lower lobe infiltrate and left pleural effusion. 2. Cardiomegaly and mild vascular congestion. Electronically Signed   By: Norva Pavlov M.D.   On: 12/26/2015 21:47    Chart has been reviewed  Family  at  Bedside  plan of care was discussed with   Daughter [name] [phone] cell, [phone] (work)  Assessment/Plan  82 year old female history of dementia with behavioral disturbances recently had been started on number of psychiatric meds was found to be more somnolent than her baseline data chest x-ray worrisome for pneumonia likely aspiration in the setting of decreased mental state  Present on Admission:  . Aspiration pneumonia (HCC) - will admit for treatment of aspiration pneumonia and HCAP will start on appropriate antibiotic coverage.   Obtain sputum cultures, blood cultures if febrile or if decompensates.  Provide oxygen as needed.  ABG showing evidence of hypoxia patient was placed on oxygen, and appeared to be satting well  Speech pathology evaluation for silent aspiration   . Dementia with behavioral disturbance continue Aricept patient has history of sundowning  . Acute encephalopathy - likely multifactorial secondary to polypharmacy as well as pneumonia. CT scan of the head unremarkable despite symptoms been persistent for the past 24 hours. We will obtain MRI if positive will need neurology consult. Currently no localized neurological findings. We'll hold sedating medications and continue her Depakote only with holding parameters once mental status improve will attempt to resume medications will need psychiatric consult once able to communicate to adjust medications appropriately to avoid over sedation  Prophylaxis: SCD    CODE STATUS:  FULL CODE   as per family  Disposition:   likely will need placement for rehabilitation possibly                           Other plan as per orders.  I have spent a total of 65 min on this admission   extra time was spent to discuss case With family at length and to reassess patient on 2 occasions  Alano Blasco 12/27/2015, 1:30  AM  [facility]  Pager [phone]   after 2 AM please page floor coverage PA If 7AM-7PM, please contact the day team taking care of the patient  [email or web address]  Password [email or web address]

## 2015-12-26 NOTE — ED Notes (Signed)
Bed: WA07 Expected date:  Expected time:  Means of arrival:  Comments: abd issuses

## 2015-12-26 NOTE — ED Notes (Addendum)
Patient here from home with complaints of increased lethargy/fatigue. Family reports patient was discharge from [facility] 2 days ago and started on Risperidone. Hx of Alzheimer.

## 2015-12-26 NOTE — ED Provider Notes (Signed)
CSN: [id number]     Arrival date & time 12/26/15  1958 History   First MD Initiated Contact with Patient 12/26/15 2056     Chief Complaint  Patient presents with  . Fatigue    Level V caveat for altered mental status HPI Patient presents with decreased mental status. She was discharged from [facility] psych yesterday. Has been going downhill since. She's been eating less. Been mostly sleeping. She was incontinent of urine last night. No trauma. No fall. Does have a history of dementia. Patient had a diagnosis of CHF with discharge to the patient's daughter states she did not know about. Patient tends to get more manic when she gets altered.   Past Medical History  Diagnosis Date  . Alzheimer disease   . Anginal pain (HCC)   . Hypercholesteremia   . Hypertension   . CHF (congestive heart failure) [facility])    Past Surgical History  Procedure Laterality Date  . Abdominal hysterectomy    . Thyroid lobectomy     Family History  Problem Relation Age of Onset  . CAD Mother   . Heart disease Mother   . Breast cancer Mother   . Dementia Mother   . Alcohol abuse Father   . Cancer Brother   . Dementia Brother    Social History  Substance Use Topics  . Smoking status: Never Smoker   . Smokeless tobacco: Never Used  . Alcohol Use: No   OB History    No data available     Review of Systems  Unable to perform ROS: Mental status change  Constitutional: Negative for appetite change.      Allergies  Review of patient's allergies indicates no known allergies.  Home Medications   Prior to Admission medications   Medication Sig Start Date End Date Taking? Authorizing Provider  aspirin 81 MG tablet Take 81 mg by mouth daily.   Yes Historical Provider, MD  carvedilol (COREG) 6.25 MG tablet Take 6.25 mg by mouth 2 (two) times daily. 12/24/15  Yes Historical Provider, MD  divalproex (DEPAKOTE) 250 MG DR tablet Take 250 mg by mouth 2 (two) times daily. 12/24/15 12/23/16 Yes Historical  Provider, MD  donepezil (ARICEPT) 10 MG tablet Take 10 mg by mouth at bedtime. 10/10/15  Yes Historical Provider, MD  guaiFENesin (ROBITUSSIN) 100 MG/5ML liquid Take 5-10 mLs (100-200 mg total) by mouth every 4 (four) hours as needed for cough. 11/24/15  Yes Arby Barrette, MD  LORazepam (ATIVAN) 0.5 MG tablet Take 0.5 mg by mouth every 6 (six) hours as needed for anxiety or sleep.  12/24/15 01/03/16 Yes Historical Provider, MD  mirtazapine (REMERON) 30 MG tablet Take 30 mg by mouth at bedtime. 12/24/15 01/23/16 Yes Historical Provider, MD  pravastatin (PRAVACHOL) 10 MG tablet Take 10 mg by mouth at bedtime.  02/21/15  Yes Historical Provider, MD  risperiDONE (RISPERDAL) 1 MG tablet Take 1 mg by mouth at bedtime.  12/24/15  Yes Historical Provider, MD  traZODone (DESYREL) 100 MG tablet Take 100 mg by mouth at bedtime.  12/10/15  Yes Historical Provider, MD  cephALEXin (KEFLEX) 500 MG capsule Take 1 capsule (500 mg total) by mouth 2 (two) times daily. x 7 days total (including the days that you were in the hospital) Patient not taking: Reported on 12/26/2015 12/12/15   Mercedes Camprubi-Soms, PA-C  sertraline (ZOLOFT) 50 MG tablet Take 2 tablets (100 mg total) by mouth daily. Patient not taking: Reported on 12/26/2015 10/20/15   York Spaniel,  MD   BP 131/46 mmHg  Pulse 75  Temp(Src) 98.6 F (37 C) (Oral)  Resp 24  SpO2 95% Physical Exam  Constitutional: She appears well-developed.  HENT:  Head: Atraumatic.  Eyes:  Pupils are constricted  Neck: Neck supple.  Cardiovascular: Normal rate.   Pulmonary/Chest: Effort normal.  Abdominal: Soft. There is no tenderness.  Neurological:  Patient is sleepy but will arouse to stimulation. Will move all extremities. Pupils are constricted  Skin: Skin is warm.    ED Course  Procedures (including critical care time) Labs Review Labs Reviewed  COMPREHENSIVE METABOLIC PANEL - Abnormal; Notable for the following:    Chloride 100 (*)    Glucose, Bld 120 (*)     Calcium 8.5 (*)    Albumin 3.2 (*)    AST 51 (*)    GFR calc non Af Amer 60 (*)    All other components within normal limits  URINALYSIS, ROUTINE W REFLEX MICROSCOPIC (NOT AT [facility]) - Abnormal; Notable for the following:    Color, Urine AMBER (*)    All other components within normal limits  CBC WITH DIFFERENTIAL/PLATELET - Abnormal; Notable for the following:    Hemoglobin 11.4 (*)    HCT 35.8 (*)    All other components within normal limits  ETHANOL  URINE RAPID DRUG SCREEN, HOSP PERFORMED  VALPROIC ACID LEVEL  AMMONIA  TROPONIN I  BRAIN NATRIURETIC PEPTIDE  BLOOD GAS, ARTERIAL    Imaging Review Dg Chest Portable 1 View  12/26/2015  CLINICAL DATA:  [name] family member states sudden onset of weakness today. Pt asleep during entire x-ray exam. Family reports patient was discharged from [facility] 2 days ago and started on Risperidone. Hx of Alzheimer, HTN, CHF. EXAM: PORTABLE CHEST 1 VIEW COMPARISON:  12/13/2015 FINDINGS: Shallow lung inflation. The heart is mildly enlarged. There is patchy density at the left lung base representing a change over prior study. Left pleural effusion is present. There is mild pulmonary vascular congestion but no overt edema. IMPRESSION: 1. New left lower lobe infiltrate and left pleural effusion. 2. Cardiomegaly and mild vascular congestion. Electronically Signed   By: Norva Pavlov M.D.   On: 12/26/2015 21:47   I have personally reviewed and evaluated these images and lab results as part of my medical decision-making.   EKG Interpretation None      MDM   Final diagnoses:  Aspiration pneumonia of left lung, unspecified aspiration pneumonia type, unspecified part of lung (HCC)  Acute encephalopathy    Patient with altered mental status. Pneumonia on x-ray. Also has had recent medication changes significant could be due to polypharmacy. Will admit to internal medicine.    Benjiman Core, MD 12/27/15 [phone]

## 2015-12-27 ENCOUNTER — Inpatient Hospital Stay (HOSPITAL_COMMUNITY): Payer: Medicare PPO

## 2015-12-27 DIAGNOSIS — G309 Alzheimer's disease, unspecified: Secondary | ICD-10-CM | POA: Diagnosis present

## 2015-12-27 DIAGNOSIS — Z803 Family history of malignant neoplasm of breast: Secondary | ICD-10-CM | POA: Diagnosis not present

## 2015-12-27 DIAGNOSIS — F0391 Unspecified dementia with behavioral disturbance: Secondary | ICD-10-CM | POA: Diagnosis not present

## 2015-12-27 DIAGNOSIS — Z7189 Other specified counseling: Secondary | ICD-10-CM

## 2015-12-27 DIAGNOSIS — G9340 Encephalopathy, unspecified: Secondary | ICD-10-CM | POA: Diagnosis present

## 2015-12-27 DIAGNOSIS — I110 Hypertensive heart disease with heart failure: Secondary | ICD-10-CM | POA: Diagnosis present

## 2015-12-27 DIAGNOSIS — I517 Cardiomegaly: Secondary | ICD-10-CM | POA: Diagnosis not present

## 2015-12-27 DIAGNOSIS — F0281 Dementia in other diseases classified elsewhere with behavioral disturbance: Secondary | ICD-10-CM | POA: Diagnosis present

## 2015-12-27 DIAGNOSIS — I5033 Acute on chronic diastolic (congestive) heart failure: Secondary | ICD-10-CM | POA: Diagnosis present

## 2015-12-27 DIAGNOSIS — Z79899 Other long term (current) drug therapy: Secondary | ICD-10-CM | POA: Diagnosis not present

## 2015-12-27 DIAGNOSIS — D72819 Decreased white blood cell count, unspecified: Secondary | ICD-10-CM | POA: Diagnosis present

## 2015-12-27 DIAGNOSIS — J690 Pneumonitis due to inhalation of food and vomit: Principal | ICD-10-CM

## 2015-12-27 DIAGNOSIS — Z7401 Bed confinement status: Secondary | ICD-10-CM | POA: Diagnosis not present

## 2015-12-27 DIAGNOSIS — R4182 Altered mental status, unspecified: Secondary | ICD-10-CM | POA: Diagnosis present

## 2015-12-27 DIAGNOSIS — Z811 Family history of alcohol abuse and dependence: Secondary | ICD-10-CM | POA: Diagnosis not present

## 2015-12-27 DIAGNOSIS — R0902 Hypoxemia: Secondary | ICD-10-CM | POA: Diagnosis present

## 2015-12-27 DIAGNOSIS — Z8249 Family history of ischemic heart disease and other diseases of the circulatory system: Secondary | ICD-10-CM | POA: Diagnosis not present

## 2015-12-27 DIAGNOSIS — I248 Other forms of acute ischemic heart disease: Secondary | ICD-10-CM | POA: Diagnosis present

## 2015-12-27 DIAGNOSIS — E785 Hyperlipidemia, unspecified: Secondary | ICD-10-CM | POA: Diagnosis present

## 2015-12-27 DIAGNOSIS — Z515 Encounter for palliative care: Secondary | ICD-10-CM | POA: Diagnosis not present

## 2015-12-27 DIAGNOSIS — Z7982 Long term (current) use of aspirin: Secondary | ICD-10-CM | POA: Diagnosis not present

## 2015-12-27 DIAGNOSIS — E860 Dehydration: Secondary | ICD-10-CM | POA: Diagnosis present

## 2015-12-27 DIAGNOSIS — I10 Essential (primary) hypertension: Secondary | ICD-10-CM | POA: Diagnosis not present

## 2015-12-27 DIAGNOSIS — R627 Adult failure to thrive: Secondary | ICD-10-CM | POA: Diagnosis present

## 2015-12-27 DIAGNOSIS — E7800 Pure hypercholesterolemia, unspecified: Secondary | ICD-10-CM | POA: Diagnosis present

## 2015-12-27 LAB — BLOOD GAS, ARTERIAL
ACID-BASE EXCESS: 3.6 mmol/L — AB (ref 0.0–2.0)
BICARBONATE: 27.3 meq/L — AB (ref 20.0–24.0)
Drawn by: 235321
FIO2: 0.21
O2 Saturation: 88.4 %
PCO2 ART: 39.5 mmHg (ref 35.0–45.0)
PO2 ART: 57.3 mmHg — AB (ref 80.0–100.0)
Patient temperature: 98.6
TCO2: 24.9 mmol/L (ref 0–100)
pH, Arterial: 7.454 — ABNORMAL HIGH (ref 7.350–7.450)

## 2015-12-27 LAB — CBC WITH DIFFERENTIAL/PLATELET
BASOS ABS: 0 10*3/uL (ref 0.0–0.1)
Basophils Relative: 0 %
Eosinophils Absolute: 0 10*3/uL (ref 0.0–0.7)
Eosinophils Relative: 0 %
HEMATOCRIT: 33.2 % — AB (ref 36.0–46.0)
Hemoglobin: 10.9 g/dL — ABNORMAL LOW (ref 12.0–15.0)
Lymphocytes Relative: 11 %
Lymphs Abs: 0.5 10*3/uL — ABNORMAL LOW (ref 0.7–4.0)
MCH: 28.5 pg (ref 26.0–34.0)
MCHC: 32.8 g/dL (ref 30.0–36.0)
MCV: 86.7 fL (ref 78.0–100.0)
Monocytes Absolute: 0.9 10*3/uL (ref 0.1–1.0)
Monocytes Relative: 19 %
NEUTROS ABS: 3.4 10*3/uL (ref 1.7–7.7)
NEUTROS PCT: 70 %
PLATELETS: 219 10*3/uL (ref 150–400)
RBC: 3.83 MIL/uL — AB (ref 3.87–5.11)
RDW: 14.8 % (ref 11.5–15.5)
WBC: 4.8 10*3/uL (ref 4.0–10.5)

## 2015-12-27 LAB — COMPREHENSIVE METABOLIC PANEL
ALBUMIN: 2.8 g/dL — AB (ref 3.5–5.0)
ALT: 43 U/L (ref 14–54)
ANION GAP: 9 (ref 5–15)
AST: 57 U/L — AB (ref 15–41)
Alkaline Phosphatase: 74 U/L (ref 38–126)
BUN: 14 mg/dL (ref 6–20)
CO2: 25 mmol/L (ref 22–32)
Calcium: 8.4 mg/dL — ABNORMAL LOW (ref 8.9–10.3)
Chloride: 103 mmol/L (ref 101–111)
Creatinine, Ser: 0.74 mg/dL (ref 0.44–1.00)
GFR calc Af Amer: >60 mL/min (ref >60)
GFR calc non Af Amer: >60 mL/min (ref >60)
Glucose, Bld: 90 mg/dL (ref 65–99)
POTASSIUM: 5 mmol/L (ref 3.5–5.1)
SODIUM: 137 mmol/L (ref 135–145)
Total Bilirubin: 0.8 mg/dL (ref 0.3–1.2)
Total Protein: 6.1 g/dL — ABNORMAL LOW (ref 6.5–8.1)

## 2015-12-27 LAB — TROPONIN I
Troponin I: 0.04 ng/mL — ABNORMAL HIGH (ref <0.031)
Troponin I: 0.03 ng/mL (ref <0.031)

## 2015-12-27 LAB — STREP PNEUMONIAE URINARY ANTIGEN: Strep Pneumo Urinary Antigen: NEGATIVE

## 2015-12-27 LAB — MRSA PCR SCREENING: MRSA BY PCR: NEGATIVE

## 2015-12-27 LAB — LACTIC ACID, PLASMA: Lactic Acid, Venous: 1.3 mmol/L (ref 0.5–2.0)

## 2015-12-27 MED ORDER — DIVALPROEX SODIUM 250 MG PO DR TAB
250.0000 mg | DELAYED_RELEASE_TABLET | Freq: Two times a day (BID) | ORAL | Status: DC
  Administered 2015-12-27 – 2015-12-30 (×7): 250 mg via ORAL
  Filled 2015-12-27 (×8): qty 1

## 2015-12-27 MED ORDER — GUAIFENESIN 100 MG/5ML PO SOLN: 100.0000 mg | ORAL | Status: DC | PRN

## 2015-12-27 MED ORDER — PIPERACILLIN-TAZOBACTAM 3.375 G IVPB
3.3750 g | Freq: Three times a day (TID) | INTRAVENOUS | Status: DC
  Administered 2015-12-27 – 2015-12-29 (×7): 3.375 g via INTRAVENOUS
  Filled 2015-12-27 (×9): qty 50

## 2015-12-27 MED ORDER — PRAVASTATIN SODIUM 10 MG PO TABS
10.0000 mg | ORAL_TABLET | Freq: Every day | ORAL | Status: DC
  Administered 2015-12-27 – 2015-12-29 (×3): 10 mg via ORAL
  Filled 2015-12-27 (×3): qty 1

## 2015-12-27 MED ORDER — ASPIRIN EC 81 MG PO TBEC
81.0000 mg | DELAYED_RELEASE_TABLET | Freq: Every day | ORAL | Status: DC
Start: 2015-12-27 — End: 2015-12-30
  Administered 2015-12-27 – 2015-12-30 (×4): 81 mg via ORAL
  Filled 2015-12-27 (×4): qty 1

## 2015-12-27 MED ORDER — CARVEDILOL 6.25 MG PO TABS
6.2500 mg | ORAL_TABLET | Freq: Two times a day (BID) | ORAL | Status: DC
  Administered 2015-12-27 – 2015-12-30 (×7): 6.25 mg via ORAL
  Filled 2015-12-27 (×7): qty 1

## 2015-12-27 MED ORDER — ACETAMINOPHEN 650 MG RE SUPP: 650.0000 mg | RECTAL | Status: DC | PRN

## 2015-12-27 MED ORDER — ACETAMINOPHEN 325 MG PO TABS
650.0000 mg | ORAL_TABLET | ORAL | Status: DC | PRN
  Administered 2015-12-27 – 2015-12-29 (×2): 650 mg via ORAL
  Filled 2015-12-27 (×2): qty 2

## 2015-12-27 MED ORDER — SODIUM CHLORIDE 0.9 % IV SOLN
INTRAVENOUS | Status: DC
  Administered 2015-12-27: 03:00:00 via INTRAVENOUS

## 2015-12-27 MED ORDER — SODIUM CHLORIDE 0.9 % IV SOLN
INTRAVENOUS | Status: DC
  Administered 2015-12-27 – 2015-12-28 (×2): via INTRAVENOUS

## 2015-12-27 MED ORDER — DONEPEZIL HCL 10 MG PO TABS
10.0000 mg | ORAL_TABLET | Freq: Every day | ORAL | Status: DC
Start: 2015-12-27 — End: 2015-12-30
  Administered 2015-12-27 – 2015-12-29 (×3): 10 mg via ORAL
  Filled 2015-12-27 (×3): qty 1

## 2015-12-27 MED ORDER — VANCOMYCIN HCL 500 MG IV SOLR
500.0000 mg | Freq: Two times a day (BID) | INTRAVENOUS | Status: DC
  Administered 2015-12-27 – 2015-12-28 (×4): 500 mg via INTRAVENOUS
  Filled 2015-12-27 (×4): qty 500

## 2015-12-27 MED ORDER — SODIUM CHLORIDE 0.9 % IV BOLUS (SEPSIS)
500.0000 mL | Freq: Once | INTRAVENOUS | Status: AC
  Administered 2015-12-27: 500 mL via INTRAVENOUS

## 2015-12-27 MED ORDER — LORAZEPAM 0.5 MG PO TABS: 0.5000 mg | ORAL_TABLET | Freq: Four times a day (QID) | ORAL | Status: DC | PRN

## 2015-12-27 NOTE — Evaluation (Signed)
Clinical/Bedside Swallow Evaluation Patient Details  Name: [facility] MRN: [id number] Date of Birth: [birth year]  Today's Date: 12/27/2015 Time: SLP Start Time (ACUTE ONLY): 1347 SLP Stop Time (ACUTE ONLY): 1401 SLP Time Calculation (min) (ACUTE ONLY): 14 min  Past Medical History:  Past Medical History  Diagnosis Date  . Alzheimer disease   . Anginal pain (HCC)   . Hypercholesteremia   . Hypertension   . CHF (congestive heart failure) (HCC)    Past Surgical History:  Past Surgical History  Procedure Laterality Date  . Abdominal hysterectomy    . Thyroid lobectomy     HPI:  [facility] is a 82 y.o. female.  She has a past medical history of Alzheimer disease; Anginal pain (HCC); Hypercholesteremia; Hypertension; and CHF (congestive heart failure) (HCC). Presented with somnolence and confusion. [name] to live with her family at home. Patient has been too tired to get out of the bed. She is not eating and drinking sleeping all day has become incontinent urine. All day today she was leaning to the left. Family felt the decreased in alerteness was a sudden thing. Last time she was seen normal was on the 3rd of February. Family reports some cough, no fever. Family brought her to emergency department.  CT and MRI head are negative for acute findings.     Assessment / Plan / Recommendation Clinical Impression  Clinical swallowing evaluation was completed.  The patient's daughter was present and she reported intermittent coughing with intake at home.  Clinically the patient presented with mild oral dysphagia characterized by delayed oral transit and decrease bolus manipulation with mild oral residue noted given dry solids.  The patient's daughter did report that the patient has always been a slow eater.  Hyo-laryngeal excursion was judged to be adequate.  Recommend a dysphagia 3 diet with thin liquids.  Given the presences  of PNA ST will follow up for therapeutic diet tolerance.      Aspiration Risk  Mild aspiration risk    Diet Recommendation   Dysphagia 3 and thin liquids.    Medication Administration: Whole meds with puree    Other  Recommendations Oral Care Recommendations: Oral care BID   Follow up Recommendations   (to be determined)    Frequency and Duration min 2x/week  2 weeks       Prognosis Prognosis for Safe Diet Advancement: Good Barriers to Reach Goals: Cognitive deficits      Swallow Study   General Date of Onset: 12/26/15 HPI: [facility] is a 82 y.o. female.  She has a past medical history of Alzheimer disease; Anginal pain (HCC); Hypercholesteremia; Hypertension; and CHF (congestive heart failure) (HCC). Presented with somnolence and confusion. [facility] recently [name] to live with her family at home. Patient has been too tired to get out of the bed. She is not eating and drinking sleeping all day has become incontinent urine. All day today she was leaning to the left. Family felt the decreased in alerteness was a sudden thing. Last time she was seen normal was on the 3rd of February. Family reports some cough, no fever. Family brought her to emergency department.  CT and MRI head are negative for acute findings.   Type of Study: Bedside Swallow Evaluation Previous Swallow Assessment: None noted.   Diet Prior to this Study: NPO Temperature Spikes Noted: Yes Respiratory Status: Nasal cannula History of Recent Intubation: No  Behavior/Cognition: Alert;Cooperative;Requires cueing Oral Cavity Assessment: Within Functional Limits;Dry Oral Care Completed by SLP: No Oral Cavity - Dentition: Adequate natural dentition;Missing dentition Vision: Functional for self-feeding Self-Feeding Abilities: Needs assist Patient Positioning: Upright in bed Baseline Vocal Quality: Normal Volitional Cough: Strong Volitional Swallow: Able to elicit     Oral/Motor/Sensory Function Overall Oral Motor/Sensory Function: Mild impairment Facial ROM: Reduced left Facial Symmetry: Abnormal symmetry left Facial Strength: Reduced left Lingual ROM: Within Functional Limits Lingual Symmetry:  (deviation to the right was observed given movement.  ) Lingual Strength: Within Functional Limits Mandible: Within Functional Limits   Ice Chips Ice chips: Not tested   Thin Liquid Thin Liquid: Within functional limits Presentation: Cup;Self Fed;Spoon    Nectar Thick Nectar Thick Liquid: Not tested   Honey Thick Honey Thick Liquid: Not tested   Puree Puree: Within functional limits Presentation: Spoon   Solid   GO   Solid: Impaired Presentation: Self Fed Oral Phase Impairments: Impaired mastication Oral Phase Functional Implications: Oral residue;Prolonged oral transit    Functional Assessment Tool Used: ASHA NOMS and clinical judgment.   Functional Limitations: Swallowing Swallow Current Status [phone]): At least 20 percent but less than 40 percent impaired, limited or restricted Swallow Goal Status [phone]): At least 20 percent but less than 40 percent impaired, limited or restricted  Dimas Aguas, MA, CCC-SLP Acute Rehab SLP [phone] Fleet Contras 12/27/2015,2:09 PM

## 2015-12-27 NOTE — Consult Note (Signed)
Consultation Note Date: 12/27/2015   Patient Name: Michele Cervantes  DOB: December 01, 1928  MRN: [id number]  Age / Sex: 82 y.o., female  PCP: Thressa Sheller, MD Referring Physician: Theodis Blaze, MD  Reason for Consultation: Establishing goals of care  Clinical Assessment/Narrative: 82 y.o. female with Alzheimer disease, presented to [facility] ED with somnolence and confusion.  Was discharged earlier this week from psychiatric unit in [place] and has been with family at home. Per family, pt has been bed bound with poor oral intake the past few days.  On day of discharge from geri-psych unit, she was awake, alert and very interactive. Chest x-ray showed new left lower lobe infiltrate with left pleural effusion and cardiomegaly. Currently being treated for PNA (possible aspiration).  Palliative consulted for goals of care.  Met with the patient and her daughter.  Initially, her mother was off the floor for MRI but did return to the room halfway through encounter.  We talked about her mother's clinical course to this point in time as well as the fact that she has a chronic progressive disease and will continue to worsen. She reports that since her mother has been hoping the geriatric psych unit that she feels that her mother has not been doing as well as she did whenever she was interacting with more people regularly at the psychiatric unit. She has been looking into options in order to have her other attend a day program to have more interaction.  Contacts/Participants in Discussion: Patient and daughter (patient with dementia) Primary Decision Maker: Wallis Mart Relationship to Patient daughter HCPOA: None on chart   SUMMARY OF RECOMMENDATIONS - [name] daughter and I discussed the chronic progressive nature of dementia. We talked about aspiration event and how this could be the first of further aspiration events in the future.  We discussed the challenge moving forward is to focus medical care on therapies in line with stated goals of spending time with family. - We talked about aggressive measures the end-of-life, recurrent hospitalizations, and the need to continue to evaluate the benefit her mother is getting from medical interventions. - We reviewed a MOST form and discussed how to develop plan of care to focus on continuing therapies that would maximize chance of being well enough to return home and limiting therapies not in line with this goal. - I provided a copy of Hard Choices for Loving People to review. - [name] daughter would like to meet again in the next day or two when her brother can also be present for a f/u family meeting and after she has a chance to review the material we talked about today. Hopefully at that time we'll be able to complete a MOST form prior to her discharge. - Daughter was very thankful for the time and information provided. No changes to current treatment plan or CODE STATUS at this time.  - She will call to schedule a family meeting either tomorrow or Tuesday .  Code Status/Advance Care Planning: Full code    Code Status Orders        Start     Ordered   12/27/15 0212  Full code   Continuous     12/27/15 0211    Code Status History    Date Active Date Inactive Code Status Order ID Comments User Context   12/12/2015  3:29 PM 12/14/2015  3:41 PM Full Code [id number]  Mercedes Camprubi-Soms, PA-C ED   07/18/2015 10:12 PM 07/19/2015  3:35 PM Full Code [id number]  Junius Creamer, NP ED   05/25/2015 11:13 PM 05/26/2015 10:10 PM Full Code [id number]  Fransico Meadow, PA-C ED      Other Directives:None  Symptom Management:    Patient reports she is feeling very well and has no concerns today. Her daughter reports that her mental status is much better today compared to yesterday and the previous few days .  Palliative Prophylaxis:   Aspiration, Bowel Regimen and Delirium  Protocol  Additional Recommendations (Limitations, Scope, Preferences):  Full Scope Treatment  Psycho-social/Spiritual:  Support System: Strong Desire for further Chaplaincy support: not addressed   Caregiving  Support/Resources and Referral to Community Resources   Prognosis: Unable to determine due to acute illness  Discharge Planning: to be determined.  Her daughter reports that she did very well in a group environment and would like to have more information on memory care units.  Chief Complaint/ Primary Diagnoses: Present on Admission:  . Dementia with behavioral disturbance . Acute encephalopathy . Aspiration pneumonia of left lung ([place])  I have reviewed the medical record, interviewed the patient and family, and examined the patient. The following aspects are pertinent.  Past Medical History  Diagnosis Date  . Alzheimer disease   . Anginal pain ([place])   . Hypercholesteremia   . Hypertension   . CHF (congestive heart failure) ([place])    Social History   Social History  . Marital Status: Widowed    Spouse Name: N/A  . Number of Children: 4  . Years of Education: some coll.   Occupational History  . retired    Social History Main Topics  . Smoking status: Never Smoker   . Smokeless tobacco: Never Used  . Alcohol Use: No  . Drug Use: No  . Sexual Activity: Not Asked   Other Topics Concern  . None   Social History Narrative   Patient does not drink caffeine.   Patient is right handed.   Family History  Problem Relation Age of Onset  . CAD Mother   . Heart disease Mother   . Breast cancer Mother   . Dementia Mother   . Alcohol abuse Father   . Cancer Brother   . Dementia Brother    Scheduled Meds: . aspirin EC  81 mg Oral Daily  . carvedilol  6.25 mg Oral BID  . divalproex  250 mg Oral BID  . donepezil  10 mg Oral QHS  . piperacillin-tazobactam (ZOSYN)  IV  3.375 g Intravenous 3 times per day  . pravastatin  10 mg Oral QHS  . vancomycin  500 mg  Intravenous BID   Continuous Infusions:  PRN Meds:.guaiFENesin, LORazepam Medications Prior to Admission:  Prior to Admission medications   Medication Sig Start Date End Date Taking? Authorizing Provider  aspirin 81 MG tablet Take 81 mg by mouth daily.   Yes Historical Provider, MD  carvedilol (COREG) 6.25 MG tablet Take 6.25 mg by mouth 2 (two) times daily. 12/24/15  Yes Historical Provider, MD  divalproex (DEPAKOTE) 250 MG DR tablet Take 250 mg by mouth 2 (two) times daily. 12/24/15 12/23/16 Yes Historical Provider, MD  donepezil (ARICEPT) 10 MG tablet Take 10 mg by mouth at bedtime. 10/10/15  Yes Historical Provider, MD  guaiFENesin (ROBITUSSIN) 100 MG/5ML liquid Take 5-10 mLs (100-200 mg total) by mouth every 4 (four) hours as needed for cough. 11/24/15  Yes Charlesetta Shanks, MD  LORazepam (ATIVAN) 0.5 MG tablet Take 0.5 mg by mouth every 6 (six) hours as needed for anxiety  or sleep.  12/24/15 01/03/16 Yes Historical Provider, MD  mirtazapine (REMERON) 30 MG tablet Take 30 mg by mouth at bedtime. 12/24/15 01/23/16 Yes Historical Provider, MD  pravastatin (PRAVACHOL) 10 MG tablet Take 10 mg by mouth at bedtime.  02/21/15  Yes Historical Provider, MD  risperiDONE (RISPERDAL) 1 MG tablet Take 1 mg by mouth at bedtime.  12/24/15  Yes Historical Provider, MD  traZODone (DESYREL) 100 MG tablet Take 100 mg by mouth at bedtime.  12/10/15  Yes Historical Provider, MD  cephALEXin (KEFLEX) 500 MG capsule Take 1 capsule (500 mg total) by mouth 2 (two) times daily. x 7 days total (including the days that you were in the hospital) Patient not taking: Reported on 12/26/2015 12/12/15   Mercedes Camprubi-Soms, PA-C  sertraline (ZOLOFT) 50 MG tablet Take 2 tablets (100 mg total) by mouth daily. Patient not taking: Reported on 12/26/2015 10/20/15   Kathrynn Ducking, MD   No Known Allergies  Review of Systems  Able to obtain due to acuity of condition and dementia   Physical Exam General: but arouses easily. Denies complaints, in  no acute distress.  HEENT: No bruits, no goiter, no JVD Heart: Regular rate and rhythm. No murmur appreciated. Lungs: diminished with scattered rhonchi Abdomen: Soft, nontender, nondistended, positive bowel sounds.  Skin: Warm and dry Neuro: Grossly intact, nonfocal.  Vital Signs: BP 120/47 mmHg  Pulse 66  Temp(Src) 100.2 F (37.9 C) (Oral)  Resp 24  Ht _0  (1.651 m)  Wt 65.5 kg (144 lb 6.4 oz)  BMI 24.03 kg/m2  SpO2 99%  SpO2: SpO2: 99 % O2 Device:SpO2: 99 % O2 Flow Rate: .O2 Flow Rate (L/min): 1 L/min  IO: Intake/output summary:  Intake/Output Summary (Last 24 hours) at 12/27/15 1456 Last data filed at 12/27/15 0420  Gross per 24 hour  Intake  90.83 ml  Output      0 ml  Net  90.83 ml    LBM: Last BM Date: 12/26/15 Baseline Weight: Weight: 65.5 kg (144 lb 6.4 oz) Most recent weight: Weight: 65.5 kg (144 lb 6.4 oz)      Palliative Assessment/Data:  Flowsheet Rows        Most Recent Value   Intake Tab    Referral Department  Hospitalist   Unit at Time of Referral  Med/Surg Unit   Palliative Care Primary Diagnosis  Sepsis/Infectious Disease   Date Notified  12/27/15   Palliative Care Type  New Palliative care   Date of Admission  12/26/15   Date first seen by Palliative Care  12/27/15   # of days Palliative referral response time  0 Day(s)   # of days IP prior to Palliative referral  1   Clinical Assessment    Palliative Performance Scale Score  50%   Pain Max last 24 hours  Not able to report   Pain Min Last 24 hours  Not able to report   Psychosocial & Spiritual Assessment    Palliative Care Outcomes    Patient/Family meeting held?  Yes   Who was at the meeting?  patients daughter   Palliative Care Outcomes  ACP counseling assistance      Additional Data Reviewed:  CBC:    Component Value Date/Time   WBC 6.3 12/26/2015 2112   HGB 11.4* 12/26/2015 2112   HCT 35.8* 12/26/2015 2112   PLT 269 12/26/2015 2112   MCV 89.7 12/26/2015 2112   NEUTROABS  5.0 12/26/2015 2112   LYMPHSABS 0.8 12/26/2015 2112  MONOABS 0.5 12/26/2015 2112   EOSABS 0.0 12/26/2015 2112   BASOSABS 0.0 12/26/2015 2112   Comprehensive Metabolic Panel:    Component Value Date/Time   NA 137 12/27/2015 0600   K 5.0 12/27/2015 0600   CL 103 12/27/2015 0600   CO2 25 12/27/2015 0600   BUN 14 12/27/2015 0600   CREATININE 0.74 12/27/2015 0600   GLUCOSE 90 12/27/2015 0600   CALCIUM 8.4* 12/27/2015 0600   AST 57* 12/27/2015 0600   ALT 43 12/27/2015 0600   ALKPHOS 74 12/27/2015 0600   BILITOT 0.8 12/27/2015 0600   PROT 6.1* 12/27/2015 0600   ALBUMIN 2.8* 12/27/2015 0600     Time In: 1250 Time Out: 1410 Time Total: 80 Greater than 50%  of this time was spent counseling and coordinating care related to the above assessment and plan.  Signed by: Micheline Rough, MD  Micheline Rough, MD  12/27/2015, 2:56 PM  Please contact Palliative Medicine Team phone at [phone] for questions and concerns.

## 2015-12-27 NOTE — Progress Notes (Signed)
Pharmacy Antibiotic Note  JAYNELL CASTAGNOLA is a 82 y.o. female admitted on 12/26/2015 with HCAP/Asp. PNA.  Pharmacy has been consulted for Vancomycin dosing.  Plan: Vancomycin  IV every 12 hours.  Goal trough 15-20 mcg/mL.  Height:  (165.1 cm) Weight: 144 lb 6.4 oz (65.5 kg) IBW/kg (Calculated) : 57  Temp (24hrs), Avg:99.1 F (37.3 C), Min:98.6 F (37 C), Max:99.6 F (37.6 C)   Recent Labs Lab 12/26/15 2112 12/27/15 0139  WBC 6.3  --   CREATININE 0.86  --   LATICACIDVEN  --  1.3    Estimated Creatinine Clearance: 43 mL/min (by C-G formula based on Cr of 0.86).    No Known Allergies  Antimicrobials this admission: Vancomycin 2/5 >>  zosyn 2/5 >>   Thank you for allowing pharmacy to be a part of this patient's care.  Aleene Davidson Crowford 12/27/2015 6:09 AM

## 2015-12-27 NOTE — Progress Notes (Signed)
Utilization Review Completed.Michele Cervantes T2/03/2016  

## 2015-12-27 NOTE — Progress Notes (Signed)
Patient ID: Michele Cervantes, female   DOB: [birth year], 82 y.o.   MRN: [id number]  [facility] PROGRESS NOTE  Michele Cervantes [id number] DOB: 05/25/1929 DOA: 12/26/2015 PCP: Thayer Headings, MD   Brief narrative:    82 y.o. female with Alzheimer disease, presented to [facility] ED with somnolence and confusion, was recently discharged from psychiatric unit in [place] and currently lives with family at home. Per family, pt has been bed bound with poor oral intake in the past week and rather lethargic, difficult to awake, leaning towards left side.   In ED, pt noted to be lethargic, CT scan of the head unremarkable for any acute abnormalities ABG showed evidence of hypercarbia. Ammonia level within normal limits Chest x-ray showed new left lower lobe infiltrate with left pleural effusion and cardiomegaly. [facility] asked to admit for further evaluation.   Assessment/Plan:    Principal Problem:   Acute encephalopathy - appears to be secondary to progressive FTT, new LLL PNA, dehydration - continue ABX, supportive care - PT/OT/SLP evaluation once pt more medically stable   Active Problems:   Dementia with behavioral disturbance - too lethargic this AM to determine if close to baseline    Aspiration pneumonia of left lung (HCC) - ABX as noted above - narrow down as clinically indicated   DVT prophylaxis - SCD's  Code Status: Full.  Family Communication:  plan of care discussed with the patient Disposition Plan: will likely need SNF but not ready for d/c yet, still lethargic and on IV ABX  IV access:  Peripheral IV  Procedures and diagnostic studies:     Ct Head Wo Contrast 2015/12/28  No acute finding or change since 2016.   Mr Brain Wo Contrast 12/28/2015 No acute intracranial abnormality. Aside from cerebral volume loss largely unremarkable noncontrast MRI  Dg Chest Portable 1 View 12/26/2015 New left lower lobe infiltrate and left pleural effusion. 2. Cardiomegaly and mild  vascular congestion.  Medical Consultants:  None  Other Consultants:  PT/OT/SLP  IAnti-Infectives:   Vancomycin 2/4 --> Zosyn 2/4 -->  Debbora Presto, MD  [facility] Pager [phone]  If 7PM-7AM, please contact night-coverage [email or web address] Password [email or web address] 12/28/2015, 2:05 PM   LOS: 0 days   HPI/Subjective: No events overnight.   Objective: Filed Vitals:   Dec 28, 2015 0214 12/28/2015 0800 12-28-2015 0803 Dec 28, 2015 1333  BP:  125/34  120/47  Pulse:   71 66  Temp:    100.2 F (37.9 C)  TempSrc:    Oral  Resp:    24  Height:  (1.651 m)     Weight: 65.5 kg (144 lb 6.4 oz)     SpO2:    99%    Intake/Output Summary (Last 24 hours) at 12-28-2015 1405 Last data filed at Dec 28, 2015 0420  Gross per 24 hour  Intake  90.83 ml  Output      0 ml  Net  90.83 ml    Exam:   General:  Pt is somnolent, confused when awake, NAD  Cardiovascular: Regular rate and rhythm, no rubs, no gallops  Respiratory: Rhonchi at bases, diminished breath sounds at bases   Abdomen: Soft, non tender, non distended, bowel sounds present, no guarding  Data Reviewed: Basic Metabolic Panel:  Recent Labs Lab 12/26/15 2112 28-Dec-2015 0600  NA 137 137  K 4.2 5.0  CL 100* 103  CO2 25 25  GLUCOSE 120* 90  BUN 12 14  CREATININE 0.86 0.74  CALCIUM 8.5* 8.4*   Liver Function Tests:  Recent Labs  Lab 12/26/15 2112 12/27/15 0600  AST 51* 57*  ALT 41 43  ALKPHOS 88 74  BILITOT 0.4 0.8  PROT 6.8 6.1*  ALBUMIN 3.2* 2.8*    Recent Labs Lab 12/26/15 2112  AMMONIA 26   CBC:  Recent Labs Lab 12/26/15 2112  WBC 6.3  NEUTROABS 5.0  HGB 11.4*  HCT 35.8*  MCV 89.7  PLT 269   Cardiac Enzymes:  Recent Labs Lab 12/26/15 2112 12/27/15 0308 12/27/15 0843  TROPONINI 0.03 0.04* 0.03   Scheduled Meds: . aspirin EC  81 mg Oral Daily  . carvedilol  6.25 mg Oral BID  . divalproex  250 mg Oral BID  . donepezil  10 mg Oral QHS  . piperacillin-tazobactam (ZOSYN)  IV  3.375 g Intravenous 3  times per day  . pravastatin  10 mg Oral QHS  . vancomycin  500 mg Intravenous BID   Continuous Infusions:

## 2015-12-27 NOTE — Progress Notes (Signed)
[name] vitals: 102.6 F;HR 77;RR 22;129/75;98% on 1 L/Min [place]. PCP on call was notified.

## 2015-12-28 ENCOUNTER — Inpatient Hospital Stay (HOSPITAL_COMMUNITY): Payer: Medicare PPO

## 2015-12-28 DIAGNOSIS — F0391 Unspecified dementia with behavioral disturbance: Secondary | ICD-10-CM

## 2015-12-28 DIAGNOSIS — I517 Cardiomegaly: Secondary | ICD-10-CM

## 2015-12-28 DIAGNOSIS — J189 Pneumonia, unspecified organism: Secondary | ICD-10-CM | POA: Diagnosis present

## 2015-12-28 DIAGNOSIS — R0989 Other specified symptoms and signs involving the circulatory and respiratory systems: Secondary | ICD-10-CM | POA: Diagnosis not present

## 2015-12-28 DIAGNOSIS — J181 Lobar pneumonia, unspecified organism: Secondary | ICD-10-CM

## 2015-12-28 DIAGNOSIS — J690 Pneumonitis due to inhalation of food and vomit: Secondary | ICD-10-CM | POA: Insufficient documentation

## 2015-12-28 DIAGNOSIS — I248 Other forms of acute ischemic heart disease: Secondary | ICD-10-CM | POA: Diagnosis present

## 2015-12-28 DIAGNOSIS — I10 Essential (primary) hypertension: Secondary | ICD-10-CM | POA: Diagnosis present

## 2015-12-28 DIAGNOSIS — Z515 Encounter for palliative care: Secondary | ICD-10-CM | POA: Insufficient documentation

## 2015-12-28 LAB — BASIC METABOLIC PANEL
ANION GAP: 9 (ref 5–15)
BUN: 15 mg/dL (ref 6–20)
CO2: 24 mmol/L (ref 22–32)
Calcium: 7.8 mg/dL — ABNORMAL LOW (ref 8.9–10.3)
Chloride: 107 mmol/L (ref 101–111)
Creatinine, Ser: 0.76 mg/dL (ref 0.44–1.00)
GFR calc Af Amer: >60 mL/min (ref >60)
Glucose, Bld: 87 mg/dL (ref 65–99)
POTASSIUM: 3.7 mmol/L (ref 3.5–5.1)
SODIUM: 140 mmol/L (ref 135–145)

## 2015-12-28 LAB — LEGIONELLA ANTIGEN, URINE

## 2015-12-28 LAB — CBC
HCT: 31.8 % — ABNORMAL LOW (ref 36.0–46.0)
Hemoglobin: 10 g/dL — ABNORMAL LOW (ref 12.0–15.0)
MCH: 28.2 pg (ref 26.0–34.0)
MCHC: 31.4 g/dL (ref 30.0–36.0)
MCV: 89.8 fL (ref 78.0–100.0)
PLATELETS: 192 10*3/uL (ref 150–400)
RBC: 3.54 MIL/uL — AB (ref 3.87–5.11)
RDW: 15 % (ref 11.5–15.5)
WBC: 2.9 10*3/uL — AB (ref 4.0–10.5)

## 2015-12-28 MED ORDER — FUROSEMIDE 10 MG/ML IJ SOLN
20.0000 mg | Freq: Once | INTRAMUSCULAR | Status: AC
  Administered 2015-12-28: 20 mg via INTRAVENOUS
  Filled 2015-12-28: qty 2

## 2015-12-28 NOTE — Evaluation (Signed)
Physical Therapy Evaluation Patient Details Name: Michele Cervantes MRN: [id number] DOB: 29-Apr-1929 Today's Date: 12/28/2015   History of Present Illness  82 y.o. female with Alzheimer disease, CHF, HTN, presented to [facility] ED with somnolence and confusion and being treated for pneumonia (possible aspiration)  Clinical Impression  Pt admitted with above diagnosis. Pt currently with functional limitations due to the deficits listed below (see PT Problem List).  Pt will benefit from skilled PT to increase their independence and safety with mobility to allow discharge to the venue listed below.   Pt assisted with short distance ambulation which was limited by weakness and fatigue.  Pt also reports new L UE deficits so RN notified.     Follow Up Recommendations Supervision/Assistance - 24 hour;Home health PT (if famly unable to provide 24/7 assist, may need SNF)    Equipment Recommendations  Rolling walker with 5" wheels    Recommendations for Other Services       Precautions / Restrictions Precautions Precautions: Fall      Mobility  Bed Mobility Overal bed mobility: Needs Assistance Bed Mobility: Supine to Sit     Supine to sit: Mod assist;HOB elevated     General bed mobility comments: assist for scooting to EOB, pt c/o poor functional use of L UE (acute)  Transfers Overall transfer level: Needs assistance Equipment used: Rolling walker (2 wheeled) Transfers: Sit to/from Stand Sit to Stand: Min assist         General transfer comment: placed L UE on RW and cues for R UE positioning  Ambulation/Gait Ambulation/Gait assistance: Min assist Ambulation Distance (Feet): 22 Feet Assistive device: Rolling walker (2 wheeled) Gait Pattern/deviations: Step-through pattern;Trunk flexed;Decreased stride length Gait velocity: decr   General Gait Details: verbal cues for safe use of RW, fatigues quickly therefore distance limited, SpO2 95% room air upon return to room, pt reports  weakness  Stairs            Wheelchair Mobility    Modified Rankin (Stroke Patients Only)       Balance Overall balance assessment: Needs assistance         Standing balance support: Bilateral upper extremity supported;During functional activity Standing balance-Leahy Scale: Poor Standing balance comment: requires UE assist                             Pertinent Vitals/Pain Pain Assessment: No/denies pain    Home Living Family/patient expects to be discharged to:: Private residence Living Arrangements: Children   Type of Home: House           Additional Comments: pt poor historian, recently discharged home from geripsych unit    Prior Function           Comments: pt reports she typically ambulates without any assist or device, per chart, mostly has been bedbound since d/c home from pysch unit     Hand Dominance        Extremity/Trunk Assessment   Upper Extremity Assessment: LUE deficits/detail       LUE Deficits / Details: no active wrist extension and poor grip which pt reports is acute (?) and RN notified   Lower Extremity Assessment: Generalized weakness      Cervical / Trunk Assessment: Kyphotic  Communication   Communication: No difficulties  Cognition Arousal/Alertness: Awake/alert Behavior During Therapy: WFL for tasks assessed/performed Overall Cognitive Status: No family/caregiver present to determine baseline cognitive functioning (hx of Alz dementia, appropriate and  cooperative during session)                      General Comments      Exercises        Assessment/Plan    PT Assessment Patient needs continued PT services  PT Diagnosis Difficulty walking;Generalized weakness   PT Problem List Decreased strength;Decreased activity tolerance;Decreased mobility;Decreased knowledge of use of DME  PT Treatment Interventions DME instruction;Gait training;Functional mobility training;Patient/family  education;Therapeutic activities;Therapeutic exercise;Balance training   PT Goals (Current goals can be found in the Care Plan section) Acute Rehab PT Goals PT Goal Formulation: With patient Time For Goal Achievement: 01/04/16 Potential to Achieve Goals: Good    Frequency Min 3X/week   Barriers to discharge        Co-evaluation               End of Session Equipment Utilized During Treatment: Gait belt Activity Tolerance: Patient limited by fatigue Patient left: in chair;with call bell/phone within reach;with chair alarm set (reapplied 1L O2 [name]) Nurse Communication: Mobility status         Time: 1610-9604 PT Time Calculation (min) (ACUTE ONLY): 21 min   Charges:   PT Evaluation $PT Eval Moderate Complexity: 1 Procedure     PT G Codes:        Michele Cervantes,Michele Cervantes 12/28/2015, 12:51 PM Michele Cervantes, PT, DPT 12/28/2015 Pager: [phone]

## 2015-12-28 NOTE — Clinical Social Work Note (Signed)
Clinical Social Work Assessment  Patient Details  Name: Michele Cervantes MRN: [id number] Date of Birth: 10-13-1929  Date of referral:  12/28/15               Reason for consult:  Facility Placement                Permission sought to share information with:    Permission granted to share information::     Name::        Agency::     Relationship::     Contact Information:     Housing/Transportation Living arrangements for the past 2 months:  Single Family Home Source of Information:  Adult Children Patient Interpreter Needed:  None Criminal Activity/Legal Involvement Pertinent to Current Situation/Hospitalization:  No - Comment as needed Significant Relationships:  Adult Children Lives with:  Adult Children Do you feel safe going back to the place where you live?  Yes Need for family participation in patient care:  Yes (Comment)  Care giving concerns:  Pt alert and oriented x2. Pt disoriented to time and situation. PT recommending SNF if pt family cannot provide 24 hour assistance.    Social Worker assessment / plan:  Pt daughter requested more information on memory care units. BSW Intern contacted pt daughter via telephone to discuss memory care units. Pt daughter reported she will be by the pt's room tomorrow between 12-1 PM.  Pt daughter inquired about short-term rehab for pt. BSW Intern explained PT recommendation for 24-hour assistance with [facility] or SNF if pt family was not able. Pt daughter discussed pt son not being home to care for pt after he has scheduled surgery. Pt daughter stated short-term rehab at a SNF would be the best option for the pt and family right now. Pt daughter still interested in exploring memory care units after pt completes short-term rehab.   CSW completed FL2 and conducted a [facility] search.  BSW Intern to follow-up with pt daughter tomorrow to provide bed offers and the list of memory care units.   CSW continuing to follow.  Employment  status:  Retired Database administrator PT Recommendations:  Skilled Holiday representative, 24 Hour Supervision Information / Referral to community resources:  Skilled Nursing Facility  Patient/Family's Response to care:  Pt alert and oriented x2. Pt disoriented to time and situation. Pt daughter pleased with BSW Intern's help.   Patient/Family's Understanding of and Emotional Response to Diagnosis, Current Treatment, and Prognosis:  Pt daughter involved in pt care and aware of level of care pt needs.   Emotional Assessment Appearance:  Appears stated age Attitude/Demeanor/Rapport:  Unable to Assess (Alert and oriented x2. Disoriented to time and situtation.) Affect (typically observed):  Unable to Assess (Alert and oriented x2. Disoriented to time and situation.) Orientation:  Oriented to Self, Oriented to Place Alcohol / Substance use:  Not Applicable Psych involvement (Current and /or in the community):  No (Comment)  Discharge Needs  Concerns to be addressed:  No discharge needs identified Readmission within the last 30 days:  Yes Current discharge risk:  None Barriers to Discharge:  Continued Medical Work up   [employer], Student-SW 12/28/2015, 4:02 PM

## 2015-12-28 NOTE — Progress Notes (Signed)
Daily Progress Note   Patient Name: Michele Cervantes       Date: 12/28/2015 DOB: January 14, 1929  Age: 82 y.o. MRN#: [id number] Attending Physician: Dorothea Ogle, MD Primary Care Physician: Thayer Headings, MD Admit Date: 12/26/2015  Reason for Consultation/Follow-up: Establishing goals of care  Subjective: Patient sitting in her chair. She remains confused but denies complaints this afternoon. She reports that she feels better than she did yesterday.  I called and spoke with her daughter on the phone. We talked about plan of care moving forward. She is still interested in finding out more about memory care units. We also talked about plan to complete most form prior to discharge.  Length of Stay: 1 day  Current Medications: Scheduled Meds:  . aspirin EC  81 mg Oral Daily  . carvedilol  6.25 mg Oral BID  . divalproex  250 mg Oral BID  . donepezil  10 mg Oral QHS  . piperacillin-tazobactam (ZOSYN)  IV  3.375 g Intravenous 3 times per day  . pravastatin  10 mg Oral QHS    Continuous Infusions:    PRN Meds: acetaminophen, acetaminophen, guaiFENesin, LORazepam  Physical Exam: Physical Exam   General: Alert, awake, in no acute distress.  HEENT: No bruits, no goiter, no JVD Heart: Regular rate and rhythm. No murmur appreciated. Lungs: Good air movement, clear Abdomen: Soft, nontender, nondistended, positive bowel sounds.  Ext: No significant edema Skin: Warm and dry Neuro: Grossly intact, nonfocal.           Psych: confused     Vital Signs: BP 111/40 mmHg  Pulse 64  Temp(Src) 98.6 F (37 C) (Oral)  Resp 20  Ht  (1.651 m)  Wt 65.5 kg (144 lb 6.4 oz)  BMI 24.03 kg/m2  SpO2 98% SpO2: SpO2: 98 % O2 Device: O2 Device: Nasal Cannula O2 Flow Rate: O2 Flow Rate (L/min): 1  L/min  Intake/output summary:  Intake/Output Summary (Last 24 hours) at 12/28/15 1934 Last data filed at 12/28/15 1631  Gross per 24 hour  Intake 2621.67 ml  Output   1250 ml  Net 1371.67 ml   LBM: Last BM Date: 12/26/15 Baseline Weight: Weight: 65.5 kg (144 lb 6.4 oz) Most recent weight: Weight: 65.5 kg (144 lb 6.4 oz)       Palliative Assessment/Data: Flowsheet  Rows        Most Recent Value   Intake Tab    Referral Department  Hospitalist   Unit at Time of Referral  Med/Surg Unit   Palliative Care Primary Diagnosis  Sepsis/Infectious Disease   Date Notified  12/27/15   Palliative Care Type  New Palliative care   Date of Admission  12/26/15   Date first seen by Palliative Care  12/27/15   # of days Palliative referral response time  0 Day(s)   # of days IP prior to Palliative referral  1   Clinical Assessment    Palliative Performance Scale Score  50%   Pain Max last 24 hours  Not able to report   Pain Min Last 24 hours  Not able to report   Psychosocial & Spiritual Assessment    Palliative Care Outcomes    Patient/Family meeting held?  Yes   Who was at the meeting?  patients daughter   Palliative Care Outcomes  ACP counseling assistance      Additional Data Reviewed: CBC    Component Value Date/Time   WBC 2.9* 12/28/2015 0628   RBC 3.54* 12/28/2015 0628   HGB 10.0* 12/28/2015 0628   HCT 31.8* 12/28/2015 0628   PLT 192 12/28/2015 0628   MCV 89.8 12/28/2015 0628   MCH 28.2 12/28/2015 0628   MCHC 31.4 12/28/2015 0628   RDW 15.0 12/28/2015 0628   LYMPHSABS 0.5* 12/27/2015 0843   MONOABS 0.9 12/27/2015 0843   EOSABS 0.0 12/27/2015 0843   BASOSABS 0.0 12/27/2015 0843    CMP     Component Value Date/Time   NA 140 12/28/2015 0628   K 3.7 12/28/2015 0628   CL 107 12/28/2015 0628   CO2 24 12/28/2015 0628   GLUCOSE 87 12/28/2015 0628   BUN 15 12/28/2015 0628   CREATININE 0.76 12/28/2015 0628   CALCIUM 7.8* 12/28/2015 0628   PROT 6.1* 12/27/2015 0600    ALBUMIN 2.8* 12/27/2015 0600   AST 57* 12/27/2015 0600   ALT 43 12/27/2015 0600   ALKPHOS 74 12/27/2015 0600   BILITOT 0.8 12/27/2015 0600   GFRNONAA >60 12/28/2015 0628   GFRAA >60 12/28/2015 0628       Problem List:  Patient Active Problem List   Diagnosis Date Noted  . Left lower lobe pneumonia 12/28/2015  . Essential hypertension 12/28/2015  . Pulmonary vascular congestion 12/28/2015  . Demand ischemia (HCC) 12/28/2015  . Acute encephalopathy 12/26/2015  . Dementia with behavioral disturbance 05/26/2015     Palliative Care Assessment & Plan    1.Code Status:  Full code    Code Status Orders        Start     Ordered   12/27/15 0212  Full code   Continuous     12/27/15 0211    Code Status History    Date Active Date Inactive Code Status Order ID Comments User Context   12/12/2015  3:29 PM 12/14/2015  3:41 PM Full Code [id number]  Mercedes Camprubi-Soms, PA-C ED   07/18/2015 10:12 PM 07/19/2015  3:35 PM Full Code [id number]  Earley Favor, NP ED   05/25/2015 11:13 PM 05/26/2015 10:10 PM Full Code [id number]  Elson Areas, PA-C ED        2. Goals of Care/Additional Recommendations: Plan for another family meeting tomorrow. Her daughter will call when she be at the hospital tomorrow to complete a most form.   4. Palliative Prophylaxis:   Bowel Regimen, Delirium Protocol and Frequent Pain  Assessment  5. Prognosis: Unable to determine  6. Discharge Planning:  To Be determined. Her daughter is interested in finding out more about memory care units   Care plan was discussed with  Patient, her daughter, and case management Thank you for allowing the Palliative Medicine Team to assist in the care of this patient.   Time In: 1310 Time Out: 1325 Total Time 15 Prolonged Time Billed no        Romie Minus, MD  12/28/2015, 7:34 PM  Please contact Palliative Medicine Team phone at [phone] for questions and concerns.

## 2015-12-28 NOTE — Clinical Social Work Placement (Signed)
   CLINICAL SOCIAL WORK PLACEMENT  NOTE  Date:  12/28/2015  Patient Details  Name: Michele Cervantes MRN: [id number] Date of Birth: [birth year]  Clinical Social Work is seeking post-discharge placement for this patient at the Skilled  Nursing Facility level of care (*CSW will initial, date and re-position this form in  chart as items are completed):  Yes   Patient/family provided with [place] Clinical Social Work Department's list of facilities offering this level of care within the geographic area requested by the patient (or if unable, by the patient's family).  Yes   Patient/family informed of their freedom to choose among providers that offer the needed level of care, that participate in Medicare, Medicaid or managed care program needed by the patient, have an available bed and are willing to accept the patient.  Yes   Patient/family informed of 's ownership interest in [facility] and [facility], as well as of the fact that they are under no obligation to receive care at these facilities.  PASRR submitted to EDS on 12/28/15     PASRR number received on       Existing PASRR number confirmed on       FL2 transmitted to all facilities in geographic area requested by pt/family on 12/28/15     FL2 transmitted to all facilities within larger geographic area on 12/28/15     Patient informed that his/her managed care company has contracts with or will negotiate with certain facilities, including the following:            Patient/family informed of bed offers received.  Patient chooses bed at       Physician recommends and patient chooses bed at      Patient to be transferred to   on  .  Patient to be transferred to facility by       Patient family notified on   of transfer.  Name of family member notified:        PHYSICIAN       Additional Comment:    _______________________________________________ Scharlene Gloss, Student-SW 12/28/2015, 4:16 PM

## 2015-12-28 NOTE — Care Management Note (Signed)
Case Management Note  Patient Details  Name: Michele Cervantes MRN: [id number] Date of Birth: Nov 12, 1929  Subjective/Objective: 82 y/o f admitted w/Acute encephalopathy.Currently full code. From home w/family.  Palliative following. CSW following.                   Action/Plan:d/c plan SNF-memory care unit   Expected Discharge Date:                  Expected Discharge Plan:  Skilled Nursing Facility  In-House Referral:  Clinical Social Work  Discharge planning Services  CM Consult  Post Acute Care Choice:    Choice offered to:     DME Arranged:    DME Agency:     HH Arranged:    HH Agency:     Status of Service:  In process, will continue to follow  Medicare Important Message Given:    Date Medicare IM Given:    Medicare IM give by:    Date Additional Medicare IM Given:    Additional Medicare Important Message give by:     If discussed at Long Length of Stay Meetings, dates discussed:    Additional Comments:  Lanier Clam, RN 12/28/2015, 3:53 PM

## 2015-12-28 NOTE — Progress Notes (Addendum)
Patient ID: Michele Cervantes, female   DOB: 09-09-1929, 82 y.o.   MRN: [id number]  [facility] PROGRESS NOTE  Michele Cervantes WUJ:[id number] DOB: 01-23-1929 DOA: 12/26/2015 PCP: Thayer Headings, MD  Brief narrative:    82 y.o. female with Alzheimer disease, presented to [facility] ED with somnolence and confusion, was recently discharged from psychiatric unit in [place] and currently lives with family at home. Per family, pt has been bed bound with poor oral intake in the past week and rather lethargic, difficult to awake, leaning towards left side.   In ED, pt noted to be lethargic, CT scan of the head unremarkable for any acute abnormalities ABG showed evidence of hypercarbia. Ammonia level within normal limits Chest x-ray showed new left lower lobe infiltrate with left pleural effusion and cardiomegaly. [facility] asked to admit for further evaluation.   Assessment/Plan:    Principal Problem:   Acute encephalopathy - appears to be secondary to progressive FTT, new LLL PNA, dehydration - pt clinically improving, more alert this am and communicating well, follows commands appropriately  - continue with supportive care, ABX, IS, ambulation as pt able to tolerate   Active Problems:   Dementia with behavioral disturbance - much better this AM, eating breakfast by herself, this is great improvement - possibly transition to oral ABX in AM with hopefully discharge in 1-2 days     Aspiration pneumonia of left lower lobe of the lung (HCC) - ABX as noted above - narrow down as clinically indicated, OK to d/c vancomycin  - aspiration precautions     Demand ischemia - mild elevation in troponin on admission  - from aspiration PNA - resolved, pt denies chest pain     Pulmonary vascular congestion - more crackles on exam this AM - follow up on 2 D ECHO, give one dose of Lasix 20 mg IV - stop IVF - weight 65 kg this AM, monitor daily weights, strict I/O    Essential HTN - continue with home  medical regimen     Mild leukopenia - likely reactive, monitor  - CBC in AM  DVT prophylaxis - SCD's  Code Status: Full.  Family Communication:  plan of care discussed with the patient Disposition Plan: home vs SNF in 1-2 days   IV access:  Peripheral IV  Procedures and diagnostic studies:     Ct Head Wo Contrast Jan 15, 2016  No acute finding or change since 2016.   Mr Brain Wo Contrast 2016/01/15 No acute intracranial abnormality. Aside from cerebral volume loss largely unremarkable noncontrast MRI  Dg Chest Portable 1 View 12/26/2015 New left lower lobe infiltrate and left pleural effusion. 2. Cardiomegaly and mild vascular congestion.  Medical Consultants:  None  Other Consultants:  PT/OT/SLP  IAnti-Infectives:   Vancomycin 2/4 --> Zosyn 2/4 -->  Debbora Presto, MD  [facility] Pager [phone]  If 7PM-7AM, please contact night-coverage [email or web address] Password [email or web address] 12/28/2015, 1:19 PM   LOS: 1 day   HPI/Subjective: No events overnight.   Objective: Filed Vitals:   2016/01/15 2139 15-Jan-2016 2300 12/28/15 0604 12/28/15 0950  BP: 131/46 121/52 113/44 119/45  Pulse: 82 75 68 62  Temp: 100.5 F (38.1 C) 98.3 F (36.8 C) 97.9 F (36.6 C) 97.9 F (36.6 C)  TempSrc: Oral Oral Oral Oral  Resp: Height:      Weight:      SpO2: 98% 100% 100% 99%    Intake/Output Summary (Last 24 hours) at 12/28/15 1319 Last data filed at 12/28/15  0900  Gross per 24 hour  Intake    745 ml  Output   1000 ml  Net   -255 ml    Exam:   General:  Pt is alert, follows commands, NAD  Cardiovascular: Regular rate and rhythm, no rubs, no gallops  Respiratory: Crackles at bases, diminished breath sounds at bases   Abdomen: Soft, non tender, non distended, bowel sounds present, no guarding  Data Reviewed: Basic Metabolic Panel:  Recent Labs Lab 12/26/15 2112 12/27/15 0600 12/28/15 0628  NA 137 137 140  K 4.2 5.0 3.7  CL 100* 103 107  CO2 GLUCOSE 120*  90 87  BUN CREATININE 0.86 0.74 0.76  CALCIUM 8.5* 8.4* 7.8*   Liver Function Tests:  Recent Labs Lab 12/26/15 2112 12/27/15 0600  AST 51* 57*  ALT 41 43  ALKPHOS 88 74  BILITOT 0.4 0.8  PROT 6.8 6.1*  ALBUMIN 3.2* 2.8*    Recent Labs Lab 12/26/15 2112  AMMONIA 26   CBC:  Recent Labs Lab 12/26/15 2112 12/27/15 0843 12/28/15 0628  WBC 6.3 4.8 2.9*  NEUTROABS 5.0 3.4  --   HGB 11.4* 10.9* 10.0*  HCT 35.8* 33.2* 31.8*  MCV 89.7 86.7 89.8  PLT 269 219 192   Cardiac Enzymes:  Recent Labs Lab 12/26/15 2112 12/27/15 0308 12/27/15 0843 12/27/15 1356  TROPONINI 0.03 0.04* 0.03 <0.03   Scheduled Meds: . aspirin EC  81 mg Oral Daily  . carvedilol  6.25 mg Oral BID  . divalproex  250 mg Oral BID  . donepezil  10 mg Oral QHS  . piperacillin-tazobactam (ZOSYN)  IV  3.375 g Intravenous 3 times per day  . pravastatin  10 mg Oral QHS  . vancomycin  500 mg Intravenous BID   Continuous Infusions: . sodium chloride 100 mL/hr at 12/28/15 1114

## 2015-12-28 NOTE — Progress Notes (Signed)
Echocardiogram 2D Echocardiogram has been performed.  Dorothey Baseman 12/28/2015, 12:55 PM

## 2015-12-28 NOTE — NC FL2 (Signed)
[place] MEDICAID FL2 LEVEL OF CARE SCREENING TOOL     IDENTIFICATION  Patient Name: Michele Cervantes Birthdate: October 17, 1929 Sex: female Admission Date (Current Location): 12/26/2015  [facility] and [place] Number:  Producer, television/film/video and Address:  [facility],  [address] [place] [address]      Provider Number: [id number]  Attending Physician Name and Address:  Dorothea Ogle, MD  Relative Name and Phone Number:       Current Level of Care: Hospital Recommended Level of Care: Skilled Nursing Facility Prior Approval Number:    Date Approved/Denied:   PASRR Number:    Discharge Plan: SNF    Current Diagnoses: Patient Active Problem List   Diagnosis Date Noted  . Left lower lobe pneumonia 12/28/2015  . Essential hypertension 12/28/2015  . Pulmonary vascular congestion 12/28/2015  . Demand ischemia (HCC) 12/28/2015  . Acute encephalopathy 12/26/2015  . Dementia with behavioral disturbance 05/26/2015    Orientation RESPIRATION BLADDER Height & Weight     Self, Place  O2 (1L) Incontinent Weight: 144 lb 6.4 oz (65.5 kg) Height:   (165.1 cm)  BEHAVIORAL SYMPTOMS/MOOD NEUROLOGICAL BOWEL NUTRITION STATUS      Continent Diet (Dysphasia 3 Diet)  AMBULATORY STATUS COMMUNICATION OF NEEDS Skin   Limited Assist Verbally Normal                       Personal Care Assistance Level of Assistance  Bathing, Feeding, Dressing Bathing Assistance: Limited assistance Feeding assistance: Independent Dressing Assistance: Limited assistance     Functional Limitations Info             SPECIAL CARE FACTORS FREQUENCY  PT (By licensed PT), OT (By licensed OT)     PT Frequency: 5 OT Frequency: 5            Contractures      Additional Factors Info  Code Status, Allergies Code Status Info: Fullcode Allergies Info: NKDA           Current Medications (12/28/2015):  This is the current hospital active medication list Current  Facility-Administered Medications  Medication Dose Route Frequency Provider Last Rate Last Dose  . acetaminophen (TYLENOL) suppository 650 mg  650 mg Rectal Q4H PRN Gerome Apley [place], PA-C      . acetaminophen (TYLENOL) tablet 650 mg  650 mg Oral Q4H PRN Dorothea Ogle, MD   650 mg at 12/27/15 2046  . aspirin EC tablet 81 mg  81 mg Oral Daily Therisa Doyne, MD   81 mg at 12/28/15 1113  . carvedilol (COREG) tablet 6.25 mg  6.25 mg Oral BID Therisa Doyne, MD   6.25 mg at 12/28/15 1113  . divalproex (DEPAKOTE) DR tablet 250 mg  250 mg Oral BID Therisa Doyne, MD   250 mg at 12/28/15 1113  . donepezil (ARICEPT) tablet 10 mg  10 mg Oral QHS Therisa Doyne, MD   10 mg at 12/27/15 2251  . guaiFENesin (ROBITUSSIN) 100 MG/5ML solution 100-200 mg  100-200 mg Oral Q4H PRN Therisa Doyne, MD      . LORazepam (ATIVAN) tablet 0.5 mg  0.5 mg Oral Q6H PRN Therisa Doyne, MD      . piperacillin-tazobactam (ZOSYN) IVPB 3.375 g  3.375 g Intravenous 3 times per day Therisa Doyne, MD   3.375 g at 12/28/15 1429  . pravastatin (PRAVACHOL) tablet 10 mg  10 mg Oral QHS Therisa Doyne, MD   10 mg at 12/27/15 2251  Discharge Medications: Please see discharge summary for a list of discharge medications.  Relevant Imaging Results:  Relevant Lab Results:   Additional Information SSN: [id number]  Arlyss Repress, LCSW

## 2015-12-28 NOTE — Evaluation (Signed)
Occupational Therapy Evaluation Patient Details Name: Michele Cervantes MRN: [id number] DOB: [birth year] Today's Date: 12/28/2015    History of Present Illness 82 y.o. female with Alzheimer disease, CHF, HTN, presented to [facility] ED with somnolence and confusion and being treated for pneumonia (possible aspiration)   Clinical Impression   Pt admitted with confusion. Pt currently with functional limitations due to the deficits listed below (see OT Problem List).  Pt will benefit from skilled OT to increase their safety and independence with ADL and functional mobility for ADL to facilitate discharge to venue listed below.      Follow Up Recommendations  SNF;Supervision/Assistance - 24 hour;Home health OT;Other (comment) (depending on care at home)    Equipment Recommendations  None recommended by OT       Precautions / Restrictions Precautions Precautions: Fall      Mobility Bed Mobility Overal bed mobility: Needs Assistance Bed Mobility: Supine to Sit     Supine to sit: Mod assist;HOB elevated     General bed mobility comments: pt in chair  Transfers Overall transfer level: Needs assistance Equipment used: 1 person hand held assist Transfers: Sit to/from Stand;Stand Pivot Transfers Sit to Stand: Min assist Stand pivot transfers: Min assist       General transfer comment: chair to bed    Balance Overall balance assessment: Needs assistance         Standing balance support: Bilateral upper extremity supported;During functional activity Standing balance-Leahy Scale: Poor Standing balance comment: requires UE assist                            ADL Overall ADL's : Needs assistance/impaired Eating/Feeding: Set up;Sitting   Grooming: Wash/dry face;Sitting   Upper Body Bathing: Set up;Sitting   Lower Body Bathing: Moderate assistance;Sit to/from stand;Cueing for safety   Upper Body Dressing : Set up;Sitting   Lower Body Dressing: Moderate  assistance;Sit to/from stand;Cueing for safety;Cueing for sequencing   Toilet Transfer: Minimal assistance;BSC   Toileting- Clothing Manipulation and Hygiene: Min guard;Sit to/from stand;Cueing for safety         General ADL Comments: Pt reports she lives with her parents and like to dance- but not like the young people do.  Pt also stated she is not hear due to drinking as she doesnt drink.  Pt is aware her L hand is not working like her right hand but she doesnt know why.  Pt reports she likes to type and she cant with her L hand currently               Pertinent Vitals/Pain Pain Assessment: No/denies pain     Hand Dominance     Extremity/Trunk Assessment Upper Extremity Assessment Upper Extremity Assessment: LUE deficits/detail LUE Deficits / Details: no wrist extension- but pt able to extend fingers with OT supporting wrist   Lower Extremity Assessment Lower Extremity Assessment: Generalized weakness   Cervical / Trunk Assessment Cervical / Trunk Assessment: Kyphotic   Communication Communication Communication: No difficulties   Cognition Arousal/Alertness: Awake/alert Behavior During Therapy: WFL for tasks assessed/performed Overall Cognitive Status: No family/caregiver present to determine baseline cognitive functioning (hx of Alz dementia, appropriate and cooperative during session)                     General Comments   pt very pleasantly confused            Home Living Family/patient expects to be discharged to::  Private residence Living Arrangements: Children   Type of Home: House                           Additional Comments: pt poor historian, recently discharged home from geripsych unit      Prior Functioning/Environment          Comments: pt reports she typically ambulates without any assist or device, per chart, mostly has been bedbound since d/c home from pysch unit    OT Diagnosis: Generalized weakness   OT Problem  List: Decreased strength;Decreased safety awareness;Decreased coordination   OT Treatment/Interventions: Self-care/ADL training;DME and/or AE instruction;Visual/perceptual remediation/compensation    OT Goals(Current goals can be found in the care plan section) Acute Rehab OT Goals Patient Stated Goal: be able to type OT Goal Formulation: With patient Time For Goal Achievement: 01/11/16 Potential to Achieve Goals: Good ADL Goals Pt Will Perform Grooming: standing;with supervision Pt Will Perform Lower Body Dressing: with supervision;sit to/from stand Pt Will Transfer to Toilet: with supervision;regular height toilet;ambulating Pt Will Perform Toileting - Clothing Manipulation and hygiene: with supervision;sit to/from stand Additional ADL Goal #1: Pt will perform exercise (HEP ) with L wrist to encourage wristt extension.    OT Frequency: Min 2X/week   Barriers to D/C:               End of Session Nurse Communication: Mobility status  Activity Tolerance: Patient tolerated treatment well Patient left: in chair   Time: 1444-1510 OT Time Calculation (min): 26 min Charges:  OT General Charges $OT Visit: 1 Procedure OT Evaluation $OT Eval Low Complexity: 1 Procedure OT Treatments $Self Care/Home Management : 23-37 mins G-Codes:    Einar Crow D December 29, 2015, 3:20 PM

## 2015-12-29 DIAGNOSIS — Z7189 Other specified counseling: Secondary | ICD-10-CM | POA: Insufficient documentation

## 2015-12-29 DIAGNOSIS — I248 Other forms of acute ischemic heart disease: Secondary | ICD-10-CM

## 2015-12-29 MED ORDER — LEVOFLOXACIN 500 MG PO TABS
500.0000 mg | ORAL_TABLET | Freq: Every day | ORAL | Status: DC
  Administered 2015-12-29 – 2015-12-30 (×2): 500 mg via ORAL
  Filled 2015-12-29 (×2): qty 1

## 2015-12-29 NOTE — Progress Notes (Signed)
Patient ID: Michele Cervantes, female   DOB: 02-Oct-1929, 82 y.o.   MRN: [id number]  [facility] PROGRESS NOTE  [name]:[id number] DOB: 02-14-1929 DOA: 12/26/2015 PCP: Thayer Headings, MD  Brief narrative:    82 y.o. female with Alzheimer disease, presented to [facility] ED with somnolence and confusion, was recently discharged from psychiatric unit in [place] and currently lives with family at home. Per family, pt has been bed bound with poor oral intake in the past week and rather lethargic, difficult to awake, leaning towards left side.   In ED, pt noted to be lethargic, CT scan of the head unremarkable for any acute abnormalities ABG showed evidence of hypercarbia. Ammonia level within normal limits Chest x-ray showed new left lower lobe infiltrate with left pleural effusion and cardiomegaly. TR[facility] asked to admit for further evaluation.   Assessment/Plan:    Principal Problem:   Acute encephalopathy - appears to be secondary to progressive FTT, new LLL PNA, dehydration - pt clinically improving, more alert this am and communicating well, follows commands appropriately  - continue with supportive care, ABX, IS, ambulation as pt able to tolerate   Active Problems:   Dementia with behavioral disturbance - much better this AM, eating breakfast by herself, this is great improvement - possibly transition to oral ABX in AM with hopefully discharge in 1-2 days  - SNF d/c planned, family in agreement     Aspiration pneumonia of left lower lobe of the lung (HCC) - ABX as noted above - narrow down to oral Levaquin  - aspiration precautions     Demand ischemia - mild elevation in troponin on admission  - from aspiration PNA - resolved, pt denies chest pain     Pulmonary vascular congestion - more crackles on exam this AM - follow up on 2 D ECHO, give one dose of Lasix 20 mg IV - stop IVF - weight 65 kg this AM, monitor daily weights, strict I/O    Essential HTN - continue  with home medical regimen     Mild leukopenia - likely reactive, monitor  - CBC in AM  DVT prophylaxis - SCD's  Code Status: Full.  Family Communication:  plan of care discussed with the patient Disposition Plan: SNF in 1-2 days   IV access:  Peripheral IV  Procedures and diagnostic studies:     Ct Head Wo Contrast 28-Dec-2015  No acute finding or change since 2016.   Mr Brain Wo Contrast 2015/12/28 No acute intracranial abnormality. Aside from cerebral volume loss largely unremarkable noncontrast MRI  Dg Chest Portable 1 View 12/26/2015 New left lower lobe infiltrate and left pleural effusion. 2. Cardiomegaly and mild vascular congestion.  Medical Consultants:  None  Other Consultants:  PT/OT/SLP  IAnti-Infectives:   Vancomycin 2/4 --> 2/7 Zosyn 2/4 --> 2/7  Debbora Presto, MD  [facility] Pager [phone]  If 7PM-7AM, please contact night-coverage [email or web address] Password [email or web address] 12/29/2015, 2:28 PM   LOS: 2 days   HPI/Subjective: No events overnight.   Objective: Filed Vitals:   12/29/15 0201 12/29/15 0454 12/29/15 0940 12/29/15 1356  BP: 100/41 113/51 95/40 116/35  Pulse: 69 65 62 60  Temp: 99.5 F (37.5 C) 98.3 F (36.8 C) 98.3 F (36.8 C) 98.6 F (37 C)  TempSrc: Oral Oral Oral Oral  Resp: Height:      Weight:  66.1 kg (145 lb 11.6 oz)    SpO2: 90% 99% 100% 98%    Intake/Output Summary (Last 24  hours) at 12/29/15 1428 Last data filed at 12/29/15 1357  Gross per 24 hour  Intake    680 ml  Output   1650 ml  Net   -970 ml    Exam:   General:  Pt is alert, follows commands, NAD  Cardiovascular: Regular rate and rhythm, no rubs, no gallops  Respiratory: Crackles at bases, diminished breath sounds at bases   Abdomen: Soft, non tender, non distended, bowel sounds present, no guarding  Data Reviewed: Basic Metabolic Panel:  Recent Labs Lab 12/26/15 2112 12/27/15 0600 12/28/15 0628  NA 137 137 140  K 4.2 5.0 3.7  CL 100* 103 107   CO2 GLUCOSE 120* 90 87  BUN CREATININE 0.86 0.74 0.76  CALCIUM 8.5* 8.4* 7.8*   Liver Function Tests:  Recent Labs Lab 12/26/15 2112 12/27/15 0600  AST 51* 57*  ALT 41 43  ALKPHOS 88 74  BILITOT 0.4 0.8  PROT 6.8 6.1*  ALBUMIN 3.2* 2.8*    Recent Labs Lab 12/26/15 2112  AMMONIA 26   CBC:  Recent Labs Lab 12/26/15 2112 12/27/15 0843 12/28/15 0628  WBC 6.3 4.8 2.9*  NEUTROABS 5.0 3.4  --   HGB 11.4* 10.9* 10.0*  HCT 35.8* 33.2* 31.8*  MCV 89.7 86.7 89.8  PLT 269 219 192   Cardiac Enzymes:  Recent Labs Lab 12/26/15 2112 12/27/15 0308 12/27/15 0843 12/27/15 1356  TROPONINI 0.03 0.04* 0.03 <0.03   Scheduled Meds: . aspirin EC  81 mg Oral Daily  . carvedilol  6.25 mg Oral BID  . divalproex  250 mg Oral BID  . donepezil  10 mg Oral QHS  . piperacillin-tazobactam (ZOSYN)  IV  3.375 g Intravenous 3 times per day  . pravastatin  10 mg Oral QHS   Continuous Infusions:

## 2015-12-29 NOTE — Progress Notes (Signed)
Occupational Therapy Treatment Patient Details Name: Michele Cervantes MRN: [id number] DOB: 1929-03-21 Today's Date: 12/29/2015    History of present illness 82 y.o. female with Alzheimer disease, CHF, HTN, presented to [facility] ED with somnolence and confusion and being treated for pneumonia (possible aspiration)   OT comments  Participated well in OT today.  Pt very thorough; cues to end activities  Follow Up Recommendations  SNF;Supervision/Assistance - 24 hour    Equipment Recommendations  None recommended by OT    Recommendations for Other Services      Precautions / Restrictions Precautions Precautions: Fall Restrictions Weight Bearing Restrictions: No       Mobility Bed Mobility         Supine to sit: Min guard     General bed mobility comments: extra time; HOB raised  Transfers   Equipment used: 1 person hand held assist   Sit to Stand: Min assist Stand pivot transfers: Min assist       General transfer comment: for balance    Balance                                   ADL       Grooming: Min guard;Standing;Wash/dry hands;Oral care                   Toilet Transfer: Minimal assistance;Stand-pivot (recliner)             General ADL Comments: pt very thorough with washing hands and teeth:  removed dentures and washed.  Extra time for all activities and steadying assistance when standing. Cues given to terminate tasks.   pt needed min A to don socks--had difficulty starting them      Vision                     Perception     Praxis      Cognition   Behavior During Therapy: [facility] for tasks assessed/performed Overall Cognitive Status: No family/caregiver present to determine baseline cognitive functioning                       Extremity/Trunk Assessment               Exercises     Shoulder Instructions       General Comments      Pertinent Vitals/ Pain       Pain Assessment: No/denies  pain  Home Living                                          Prior Functioning/Environment              Frequency Min 2X/week     Progress Toward Goals  OT Goals(current goals can now be found in the care plan section)  Progress towards OT goals: Progressing toward goals     Plan      Co-evaluation                 End of Session     Activity Tolerance Patient tolerated treatment well   Patient Left in chair;with chair alarm set;with call bell/phone within reach;with nursing/sitter in room   Nurse Communication          Time: 6578-4696 OT Time Calculation (min): 20 min  Charges: OT General Charges $OT Visit: 1 Procedure OT Treatments $Self Care/Home Management : 8-22 mins  Shalyn Koral 12/29/2015, 10:43 AM  Marica Otter, OTR/L [phone] 12/29/2015

## 2015-12-29 NOTE — Progress Notes (Signed)
Physical Therapy Treatment Patient Details Name: Michele Cervantes MRN: [id number] DOB: 19-Sep-1929 Today's Date: 12/29/2015    History of Present Illness 82 y.o. female with Alzheimer disease, CHF, HTN, presented to [facility] ED with somnolence and confusion and being treated for pneumonia (possible aspiration)    PT Comments    Pt OOB in recliner on 2 lts with sats at 100%.  Removed O2 for trial.  Assisted to [facility] to void. Assisted with hygiene as pt was unable to maintain standing balance and self perform.  Assisted with amb a greater distance in hallway.  Avg RA sat 93%.  Tolerated session well.  Appears close to base line.   Follow Up Recommendations  SNF;Home health PT (no family present to discuss D/C plans)     Equipment Recommendations       Recommendations for Other Services       Precautions / Restrictions Precautions Precautions: Fall Precaution Comments: monitor O2 sats Restrictions Weight Bearing Restrictions: No    Mobility  Bed Mobility         Supine to sit: Min guard     General bed mobility comments: Pt OOB in recliner  Transfers Overall transfer level: Needs assistance Equipment used: 1 person hand held assist Transfers: Sit to/from Stand;Stand Pivot Transfers Sit to Stand: Min assist;Min guard Stand pivot transfers: Min assist;Min guard       General transfer comment: 50% VC's on safety with hand placement and turn completion from recliner to [facility].  Pt mildly fearful of falling.   Ambulation/Gait Ambulation/Gait assistance: Min assist Ambulation Distance (Feet): 80 Feet Assistive device: Rolling walker (2 wheeled) Gait Pattern/deviations: Step-through pattern;Decreased stride length;Trunk flexed;Shuffle Gait velocity: decreased   General Gait Details: 25% assist to amneuver walker around obsticles mainly due to decreased vision (lost glasses).  Amb on RA sats avg 95%.     Stairs            Wheelchair Mobility    Modified Rankin (Stroke  Patients Only)       Balance                                    Cognition Arousal/Alertness: Awake/alert Behavior During Therapy: WFL for tasks assessed/performed Overall Cognitive Status: Within Functional Limits for tasks assessed (pleasant/conversive/following directions)                      Exercises      General Comments        Pertinent Vitals/Pain Pain Assessment: No/denies pain    Home Living                      Prior Function            PT Goals (current goals can now be found in the care plan section) Progress towards PT goals: Progressing toward goals    Frequency  Min 3X/week    PT Plan Current plan remains appropriate    Co-evaluation             End of Session Equipment Utilized During Treatment: Gait belt Activity Tolerance: Patient tolerated treatment well Patient left: in chair;with call bell/phone within reach;with chair alarm set     Time: 1110-1135 PT Time Calculation (min) (ACUTE ONLY): 25 min  Charges:  $Gait Training: 8-22 mins $Therapeutic Activity: 8-22 mins  G Codes:      Rica Koyanagi  PTA [facility]  Acute  Rehab Pager      [phone]

## 2015-12-29 NOTE — Progress Notes (Signed)
BSW Intern continuing to follow.  Per MD, pt is medically ready for dc when bed is available.   BSW Intern met with pt, pt daughter, and pt son at bedside to provide bed offer of [facility]. Pt daughter pleased with option. Pt family chooses bed at [facility] for pt following hospitalization for short-term rehab.   BSW Intern also provided a list of Assisted Living Facilities with memory care units that pt daughter inquired about. Pt son and daughter inquired about finances and insurance coverage with ALFs. BSW Intern explained ALFs would be covered by private pay. BSW Intern suggested applying for Medicaid and explained some ALFs are in contract with Medicaid.  Pt son and daughter understanding and thanked BSW Intern for the help.  CSW in contact with [facility] regarding [facility] Medicare Authorization.   CSW continuing to follow and provide disposition needs when insurance authorization is approved.

## 2015-12-29 NOTE — Progress Notes (Signed)
Speech Language Pathology Treatment: Dysphagia  Patient Details Name: Michele Cervantes MRN: [id number] DOB: 10/09/1929 Today's Date: 12/29/2015 Time: 4098-1191 SLP Time Calculation (min) (ACUTE ONLY): 27 min  Assessment / Plan / Recommendation Clinical Impression  Pt seen with her family present - son and daughter Michele Cervantes present.   Only approximately 1/3 of meal tray consumed - son asking if plate could be warmed up again - due to infection control, advised this could not be done.  Son reports pt frequently warms up food at home multiple times and eats very small amounts at a time.  Provided pt with boost breeze - which she stated she enjoyed.  Spoke to RN re: possibly ordering for pt TID due to concern for inadequate nutrition - she placed order.    Pt without s/s of aspiration today with consumption of intake.  She did decline to consume solids stating she had just finished eating.  SLP reviewed precautions with pt/daugher and son to maximize swallow efficiency and safety.  Of note, pt does admit that occasionally she must "tap" on her chest to help clear when foods stick *pointing to esophagus.  She denies reflux symptoms or regurgitation with food lodging however.  ? If this could be consistent with esophageal dysmotility.  Advised pt to follow esophageal and aspiration precautions.    No follow up indicated as all education completed.  Thanks for the consult.     HPI HPI: Michele Cervantes is a 82 y.o. female.  She has a past medical history of Alzheimer disease; Anginal pain (HCC); Hypercholesteremia; Hypertension; and CHF (congestive heart failure) (HCC). Presented with somnolence and confusion.  Patient [name] to live with her family at home. Patient has been too tired to get out of the bed, had decreased po intake, and had become incontinent urine. All day on day of admit, she was leaning to the left. Family felt the decreased in alertness  was a sudden thing per md note. Last time she was seen normal was on the 3rd of February. Family reports some cough, no fever. Pt found to have potential pna.  She underwent BSE on Sunday 2/5 and today follow up indicated to assure tolerance and for family education.        SLP Plan        Recommendations  Diet recommendations: Dysphagia 3 (mechanical soft);Thin liquid Liquids provided via: Cup;Straw Medication Administration: Whole meds with puree Supervision: Patient able to self feed Compensations: Slow rate;Small sips/bites (start and follow medicine with water, esophageal precautions) Postural Changes and/or Swallow Maneuvers: Seated upright 90 degrees;Upright 30-60 min after meal             Follow up Recommendations: None     GO           Functional Assessment Tool Used: clinical judgement Functional Limitations: Swallowing Swallow Current Status (Y7829): At least 1 percent but less than 20 percent impaired, limited or restricted Swallow Goal Status [phone]): At least 20 percent but less than 40 percent impaired, limited or restricted Swallow Discharge Status [phone]): At least 1 percent but less than 20 percent impaired, limited or restricted    Mills Koller, MS [facility] SLP [phone]

## 2015-12-29 NOTE — Progress Notes (Signed)
Daily Progress Note   Patient Name: Michele Cervantes       Date: 12/29/2015 DOB: 09-May-1929  Age: 82 y.o. MRN#: [id number] Attending Physician: Theodis Blaze, MD Primary Care Physician: Thressa Sheller, MD Admit Date: 12/26/2015  Reason for Consultation/Follow-up: Establishing goals of care  Subjective: Patient sitting in her chair. She remains confused but denies complaints. She reports that she feels better than she did yesterday.  I met with her daughter and son.  We completed a MOST form.  See goals below.  Length of Stay: 2 days  Current Medications: Scheduled Meds:  . aspirin EC  81 mg Oral Daily  . carvedilol  6.25 mg Oral BID  . divalproex  250 mg Oral BID  . donepezil  10 mg Oral QHS  . levofloxacin  500 mg Oral Daily  . pravastatin  10 mg Oral QHS    Continuous Infusions:    PRN Meds: acetaminophen, acetaminophen, guaiFENesin, LORazepam  Physical Exam: Physical Exam   General: Alert, awake, in no acute distress.  HEENT: No bruits, no goiter, no JVD Heart: Regular rate and rhythm. No murmur appreciated. Lungs: Good air movement, clear Abdomen: Soft, nontender, nondistended, positive bowel sounds.  Ext: No significant edema Skin: Warm and dry Neuro: Grossly intact, nonfocal.           Psych: confused     Vital Signs: BP 119/44 mmHg  Pulse 62  Temp(Src) 98 F (36.7 C) (Oral)  Resp 20  Ht 5' 5" (1.651 m)  Wt 66.1 kg (145 lb 11.6 oz)  BMI 24.25 kg/m2  SpO2 100% SpO2: SpO2: 100 % O2 Device: O2 Device: Nasal Cannula O2 Flow Rate: O2 Flow Rate (L/min): 2 L/min  Intake/output summary:   Intake/Output Summary (Last 24 hours) at 12/29/15 2348 Last data filed at 12/29/15 2110  Gross per 24 hour  Intake    650 ml  Output   1050 ml  Net   -400 ml   LBM:  Last BM Date: 12/29/15 Baseline Weight: Weight: 65.5 kg (144 lb 6.4 oz) Most recent weight: Weight: 66.1 kg (145 lb 11.6 oz)       Palliative Assessment/Data: Flowsheet Rows        Most Recent Value   Intake Tab    Referral Department  Hospitalist   Unit at Time  of Referral  Med/Surg Unit   Palliative Care Primary Diagnosis  Sepsis/Infectious Disease   Date Notified  12/27/15   Palliative Care Type  New Palliative care   Reason for referral  Clarify Goals of Care   Date of Admission  12/26/15   Date first seen by Palliative Care  12/27/15   # of days Palliative referral response time  0 Day(s)   # of days IP prior to Palliative referral  1   Clinical Assessment    Palliative Performance Scale Score  50%   Pain Max last 24 hours  Not able to report   Pain Min Last 24 hours  Not able to report   Psychosocial & Spiritual Assessment    Palliative Care Outcomes    Patient/Family meeting held?  Yes   Who was at the meeting?  patients daughter   Palliative Care Outcomes  ACP counseling assistance      Additional Data Reviewed: CBC    Component Value Date/Time   WBC 2.9* 12/28/2015 0628   RBC 3.54* 12/28/2015 0628   HGB 10.0* 12/28/2015 0628   HCT 31.8* 12/28/2015 0628   PLT 192 12/28/2015 0628   MCV 89.8 12/28/2015 0628   MCH 28.2 12/28/2015 0628   MCHC 31.4 12/28/2015 0628   RDW 15.0 12/28/2015 0628   LYMPHSABS 0.5* 12/27/2015 0843   MONOABS 0.9 12/27/2015 0843   EOSABS 0.0 12/27/2015 0843   BASOSABS 0.0 12/27/2015 0843    CMP     Component Value Date/Time   NA 140 12/28/2015 0628   K 3.7 12/28/2015 0628   CL 107 12/28/2015 0628   CO2 24 12/28/2015 0628   GLUCOSE 87 12/28/2015 0628   BUN 15 12/28/2015 0628   CREATININE 0.76 12/28/2015 0628   CALCIUM 7.8* 12/28/2015 0628   PROT 6.1* 12/27/2015 0600   ALBUMIN 2.8* 12/27/2015 0600   AST 57* 12/27/2015 0600   ALT 43 12/27/2015 0600   ALKPHOS 74 12/27/2015 0600   BILITOT 0.8 12/27/2015 0600   GFRNONAA >60  12/28/2015 0628   GFRAA >60 12/28/2015 0628       Problem List:  Patient Active Problem List   Diagnosis Date Noted  . Left lower lobe pneumonia 12/28/2015  . Essential hypertension 12/28/2015  . Pulmonary vascular congestion 12/28/2015  . Demand ischemia ([place]) 12/28/2015  . Aspiration pneumonia of left lung ([place])   . Palliative care encounter   . Acute encephalopathy 12/26/2015  . Dementia with behavioral disturbance 05/26/2015     Palliative Care Assessment & Plan    1.Code Status:  Full code    Code Status Orders        Start     Ordered   12/27/15 0212  Full code   Continuous     12/27/15 0211    Code Status History    Date Active Date Inactive Code Status Order ID Comments User Context   12/12/2015  3:29 PM 12/14/2015  3:41 PM Full Code [id number]  Mercedes Camprubi-Soms, PA-C ED   07/18/2015 10:12 PM 07/19/2015  3:35 PM Full Code [id number]  Junius Creamer, NP ED   05/25/2015 11:13 PM 05/26/2015 10:10 PM Full Code [id number]  Fransico Meadow, PA-C ED        2. Goals of Care/Additional Recommendations:  I met with patient's children. We reviewed a MOST form and discussed how to develop plan of care to focus on continuing therapies that would maximize chance of being well enough to return home and limiting therapies  not in line with this goal.  They want to continue with aggressive therapy.  We completed MOST form today. Full Code, Full scope of treatment, IVF and ABX if indicated, time limited trial of feeding tube.  4. Palliative Prophylaxis:   Bowel Regimen, Delirium Protocol and Frequent Pain Assessment  5. Prognosis: Unable to determine  6. Discharge Planning:  To SNF for rehab.  Long term, her daughter would like to explore memory care unit placement   Care plan was discussed with  Patient, her daughter, her sonThank you for allowing the Palliative Medicine Team to assist in the care of this patient.   Time In: 1820 Time Out: 1900 Total Time 40 Prolonged  Time Billed no        Micheline Rough, MD  12/29/2015, 11:48 PM  Please contact Palliative Medicine Team phone at [phone] for questions and concerns.

## 2015-12-30 DIAGNOSIS — I10 Essential (primary) hypertension: Secondary | ICD-10-CM

## 2015-12-30 LAB — BASIC METABOLIC PANEL
ANION GAP: 8 (ref 5–15)
BUN: 10 mg/dL (ref 6–20)
CALCIUM: 8 mg/dL — AB (ref 8.9–10.3)
CO2: 28 mmol/L (ref 22–32)
Chloride: 103 mmol/L (ref 101–111)
Creatinine, Ser: 0.66 mg/dL (ref 0.44–1.00)
GFR calc Af Amer: >60 mL/min (ref >60)
GLUCOSE: 90 mg/dL (ref 65–99)
POTASSIUM: 4.3 mmol/L (ref 3.5–5.1)
Sodium: 139 mmol/L (ref 135–145)

## 2015-12-30 LAB — CBC
HEMATOCRIT: 35 % — AB (ref 36.0–46.0)
Hemoglobin: 11 g/dL — ABNORMAL LOW (ref 12.0–15.0)
MCH: 28.4 pg (ref 26.0–34.0)
MCHC: 31.4 g/dL (ref 30.0–36.0)
MCV: 90.2 fL (ref 78.0–100.0)
PLATELETS: 232 10*3/uL (ref 150–400)
RBC: 3.88 MIL/uL (ref 3.87–5.11)
RDW: 14.6 % (ref 11.5–15.5)
WBC: 3.6 10*3/uL — AB (ref 4.0–10.5)

## 2015-12-30 MED ORDER — LEVOFLOXACIN 500 MG PO TABS
500.0000 mg | ORAL_TABLET | Freq: Every day | ORAL | Status: DC
Start: 2015-12-30 — End: 2016-03-15

## 2015-12-30 NOTE — Progress Notes (Signed)
Report called to [facility].  Earnest Conroy. Clelia Croft, RN

## 2015-12-30 NOTE — Clinical Social Work Placement (Signed)
Patient is set to discharge to [facility] today. [facility] Medicare authorization obtained. Patient & daughter, Dois Davenport aware. Discharge packet given to RN, Nehemiah Settle. [facility] called for transport to pickup at 2:30pm.     Lincoln Maxin, LCSW [facility] Clinical Social Worker cell #: [phone]    CLINICAL SOCIAL WORK PLACEMENT  NOTE  Date:  12/30/2015  Patient Details  Name: Michele Cervantes MRN: [id number] Date of Birth: [birth year]  Clinical Social Work is seeking post-discharge placement for this patient at the Skilled  Nursing Facility level of care (*CSW will initial, date and re-position this form in  chart as items are completed):  Yes   Patient/family provided with [place] Clinical Social Work Department's list of facilities offering this level of care within the geographic area requested by the patient (or if unable, by the patient's family).  Yes   Patient/family informed of their freedom to choose among providers that offer the needed level of care, that participate in Medicare, Medicaid or managed care program needed by the patient, have an available bed and are willing to accept the patient.  Yes   Patient/family informed of [facility]'s ownership interest in [facility] and [facility], as well as of the fact that they are under no obligation to receive care at these facilities.  PASRR submitted to EDS on 12/28/15     PASRR number received on       Existing PASRR number confirmed on       FL2 transmitted to all facilities in geographic area requested by pt/family on 12/28/15     FL2 transmitted to all facilities within larger geographic area on 12/28/15     Patient informed that his/her managed care company has contracts with or will negotiate with certain facilities, including the following:        Yes   Patient/family informed of bed offers received.  Patient chooses bed at [facility]     Physician recommends and  patient chooses bed at      Patient to be transferred to [facility] on 12/30/15.  Patient to be transferred to facility by [facility]     Patient family notified on 12/30/15 of transfer.  Name of family member notified:  patient's daughter, Dois Davenport via phone     PHYSICIAN       Additional Comment:    _______________________________________________ Arlyss Repress, LCSW 12/30/2015, 12:47 PM

## 2015-12-30 NOTE — Discharge Instructions (Signed)
Aspiration Pneumonia  Aspiration pneumonia is an infection in your lungs. It occurs when food, liquid, or stomach contents (vomit) are inhaled (aspirated) into your lungs. When these things get into your lungs, swelling (inflammation) and infection can occur. This can make it difficult for you to breathe. Aspiration pneumonia is a serious condition and can be life threatening. RISK FACTORS Aspiration pneumonia is more likely to occur when a person's cough (gag) reflex or ability to swallow has been decreased. Some things that can do this include:   Having a brain injury or disease, such as stroke, seizures, Parkinson's disease, dementia, or amyotrophic lateral sclerosis (ALS).   Being given general anesthetic for procedures.   Being in a coma (unconscious).   Having a narrowing of the tube that carries food to the stomach (esophagus).   Drinking too much alcohol. If a person passes out and vomits, vomit can be swallowed into the lungs.   Taking certain medicines, such as tranquilizers or sedatives.  SIGNS AND SYMPTOMS   Coughing after swallowing food or liquids.   Breathing problems, such as wheezing or shortness of breath.   Bluish skin. This can be caused by lack of oxygen.   Coughing up food or mucus. The mucus might contain blood, greenish material, or yellowish-white fluid (pus).   Fever.   Chest pain.   Being more tired than usual (fatigue).   Sweating more than usual.   Bad breath.  DIAGNOSIS  A physical exam will be done. During the exam, the health care provider will listen to your lungs with a stethoscope to check for:   Crackling sounds in the lungs.  Decreased breath sounds.  A rapid heartbeat. Various tests may be ordered. These may include:   Chest X-ray.   CT scan.   Swallowing study. This test looks at how food is swallowed and whether it goes into your breathing tube (trachea) or food pipe (esophagus).   Sputum culture. Saliva and  mucus (sputum) are collected from the lungs or the tubes that carry air to the lungs (bronchi). The sputum is then tested for bacteria.   Bronchoscopy. This test uses a flexible tube (bronchoscope) to see inside the lungs. TREATMENT  Treatment will usually include antibiotic medicines. Other medicines may also be used to reduce fever or pain. You may need to be treated in the hospital. In the hospital, your breathing will be carefully monitored. Depending on how well you are breathing, you may need to be given oxygen, or you may need breathing support from a breathing machine (ventilator). For people who fail a swallowing study, a feeding tube might be placed in the stomach, or they may be asked to avoid certain food textures or liquids when they eat. HOME CARE INSTRUCTIONS   Carefully follow any special eating instructions you were given, such as avoiding certain food textures or thickening liquids. This reduces the risk of developing aspiration pneumonia again.  Only take over-the-counter or prescription medicines as directed by your health care provider. Follow the directions carefully.   If you were prescribed antibiotics, take them as directed. Finish them even if you start to feel better.   Rest as instructed by your health care provider.   Keep all follow-up appointments with your health care provider.  SEEK MEDICAL CARE IF:   You develop worsening shortness of breath, wheezing, or difficulty breathing.   You develop a fever.   You have chest pain.  MAKE SURE YOU:   Understand these instructions.  Will watch   your condition.  Will get help right away if you are not doing well or get worse.   This information is not intended to replace advice given to you by your health care provider. Make sure you discuss any questions you have with your health care provider.   Document Released: 09/04/2009 Document Revised: 11/12/2013 Document Reviewed: 04/25/2013 Elsevier  Interactive Patient Education 2016 [employer] Inc.  

## 2015-12-30 NOTE — Care Management Important Message (Signed)
Important Message  Patient Details  Name: Michele Cervantes MRN: [id number] Date of Birth: 08-12-1929   Medicare Important Message Given:  Yes    Haskell Flirt 12/30/2015, 10:56 AMImportant Message  Patient Details  Name: Michele Cervantes MRN: [id number] Date of Birth: June 02, 1929   Medicare Important Message Given:  Yes    Haskell Flirt 12/30/2015, 10:56 AM

## 2015-12-30 NOTE — Discharge Summary (Addendum)
Physician Discharge Summary  Michele Cervantes:[id number] DOB: 07-11-1929 DOA: 12/26/2015  PCP: Thayer Headings, MD  Admit date: 12/26/2015 Discharge date: 12/30/2015  Recommendations for Outpatient Follow-up:  1. Pt will need to follow up with PCP in 2-3 weeks post discharge 2. Please obtain BMP to evaluate electrolytes and kidney function 3. Please also check CBC to evaluate Hg and Hct levels 4. Continue Levaquin for 5 more days post discharge 5. Aspiration precautions, Dys 3 diet recommended   Discharge Diagnoses:  Principal Problem:   Acute encephalopathy Active Problems:   Dementia with behavioral disturbance   Left lower lobe pneumonia   Pulmonary vascular congestion   Demand ischemia (HCC)   Essential hypertension   Aspiration pneumonia of left lung (HCC)   Palliative care encounter   Goals of care, counseling/discussion    Discharge Condition: Stable  Diet recommendation: Dys 3 diet, aspiration precautions     Brief narrative:    82 y.o. female with Alzheimer disease, presented to [facility] ED with somnolence and confusion, was recently discharged from psychiatric unit in [place] and currently lives with family at home. Per family, pt has been bed bound with poor oral intake in the past week and rather lethargic, difficult to awake, leaning towards left side.   In ED, pt noted to be lethargic, CT scan of the head unremarkable for any acute abnormalities ABG showed evidence of hypercarbia. Ammonia level within normal limits Chest x-ray showed new left lower lobe infiltrate with left pleural effusion and cardiomegaly. Pt received broad spectrum abx for pneumonia, currently on Levaquin.   Assessment/Plan:    Principal Problem:  Acute encephalopathy / Dementia with behavioral disturbance - Likely due to progressive FTT, new LLL PNA, dehydration - Treated with broad spectrum abx for pneumonia, transitioned to oral Levaquin  - Mental status better  - Continue Aricept  10 mg at bedtime   Active Problems:  Aspiration pneumonia of left lower lobe of the lung (HCC) - Completed vanco and zosyn through 2/7 and now on Levaquin  - Continue aspiration precautions    Demand ischemia / Troponin elevation  - Mild elevation in troponin level on admission likely demand ischemia for respiratory infection  - Resolved - No reports of chest pain    Dyslipidemia - Continue Pravachol daily    Pulmonary vascular congestion / Acute on chronic diastolic CHF - 2 D ECHO on 2/6 showed EF 60% with grade 1 diastolic dysfunction - Weight in past 72 hours: 61.2kg --> 66.1 kg --> 67.1 kg - Continue coreg 6.5 mg BID - Continue aspirin daily    Essential HTN - Continue coreg 6.25 mg BID   Mild leukopenia - Likely reactive, monitor    Code Status: Full.  Family Communication: plan of care discussed with the patient Disposition Plan: SNF   IV access:  Peripheral IV  Procedures and diagnostic studies:    Ct Head Wo Contrast 12/27/2015 No acute finding or change since 2016.   Mr Brain Wo Contrast 12/27/2015 No acute intracranial abnormality. Aside from cerebral volume loss largely unremarkable noncontrast MRI  Dg Chest Portable 1 View 12/26/2015 New left lower lobe infiltrate and left pleural effusion. 2. Cardiomegaly and mild vascular congestion.  Medical Consultants:  None  Other Consultants:  PT/OT/SLP       Discharge Exam: Filed Vitals:   12/30/15 0629 12/30/15 0949  BP: 123/43   Pulse: 65 70  Temp: 98.2 F (36.8 C)   Resp: 20    Filed Vitals:   12/29/15 2142  12/30/15 0144 12/30/15 0629 12/30/15 0949  BP: 119/44 129/48 123/43   Pulse: 62 67 65 70  Temp: 98 F (36.7 C) 99 F (37.2 C) 98.2 F (36.8 C)   TempSrc: Oral Oral Oral   Resp: Height:      Weight:   67.631 kg (149 lb 1.6 oz)   SpO2: 100% 99% 96% 94%    General: Pt is alert, follows commands appropriately, not in acute distress Cardiovascular:  Regular rate and rhythm, no rubs, no gallops Respiratory: Clear to auscultation bilaterally, rhonchi at bases  Abdominal: Soft, non tender, non distended, bowel sounds +, no guarding  Discharge Instructions  Discharge Instructions    Diet - low sodium heart healthy    Complete by:  As directed      Increase activity slowly    Complete by:  As directed             Medication List    STOP taking these medications        cephALEXin 500 MG capsule  Commonly known as:  KEFLEX     sertraline 50 MG tablet  Commonly known as:  ZOLOFT      TAKE these medications        aspirin 81 MG tablet  Take 81 mg by mouth daily.     carvedilol 6.25 MG tablet  Commonly known as:  COREG  Take 6.25 mg by mouth 2 (two) times daily.     divalproex 250 MG DR tablet  Commonly known as:  DEPAKOTE  Take 250 mg by mouth 2 (two) times daily.     donepezil 10 MG tablet  Commonly known as:  ARICEPT  Take 10 mg by mouth at bedtime.     guaiFENesin 100 MG/5ML liquid  Commonly known as:  ROBITUSSIN  Take 5-10 mLs (100-200 mg total) by mouth every 4 (four) hours as needed for cough.     levofloxacin 500 MG tablet  Commonly known as:  LEVAQUIN  Take 1 tablet (500 mg total) by mouth daily.     LORazepam 0.5 MG tablet  Commonly known as:  ATIVAN  Take 0.5 mg by mouth every 6 (six) hours as needed for anxiety or sleep.     mirtazapine 30 MG tablet  Commonly known as:  REMERON  Take 30 mg by mouth at bedtime.     pravastatin 10 MG tablet  Commonly known as:  PRAVACHOL  Take 10 mg by mouth at bedtime.     risperiDONE 1 MG tablet  Commonly known as:  RISPERDAL  Take 1 mg by mouth at bedtime.     traZODone 100 MG tablet  Commonly known as:  DESYREL  Take 100 mg by mouth at bedtime.           Follow-up Information    Follow up with Thayer Headings, MD.   Specialty:  Internal Medicine   Contact information:   [address] [place] [address] [phone]         The results of significant diagnostics from this hospitalization (including imaging, microbiology, ancillary and laboratory) are listed below for reference.     Microbiology: Recent Results (from the past 240 hour(s))  MRSA PCR Screening     Status: None   Collection Time: 12/27/15 12:56 PM  Result Value Ref Range Status   MRSA by PCR NEGATIVE NEGATIVE Final    Comment:        The GeneXpert MRSA Assay (FDA  approved for NASAL specimens only), is one component of a comprehensive MRSA colonization surveillance program. It is not intended to diagnose MRSA infection nor to guide or monitor treatment for MRSA infections.      Labs: Basic Metabolic Panel:  Recent Labs Lab 12/26/15 2112 12/27/15 0600 12/28/15 0628 12/30/15 0617  NA 137 137 140 139  K 4.2 5.0 3.7 4.3  CL 100* 103 107 103  CO2 GLUCOSE 120* 90 87 90  BUN CREATININE 0.86 0.74 0.76 0.66  CALCIUM 8.5* 8.4* 7.8* 8.0*   Liver Function Tests:  Recent Labs Lab 12/26/15 2112 12/27/15 0600  AST 51* 57*  ALT 41 43  ALKPHOS 88 74  BILITOT 0.4 0.8  PROT 6.8 6.1*  ALBUMIN 3.2* 2.8*    Recent Labs Lab 12/26/15 2112  AMMONIA 26   CBC:  Recent Labs Lab 12/26/15 2112 12/27/15 0843 12/28/15 0628 12/30/15 0617  WBC 6.3 4.8 2.9* 3.6*  NEUTROABS 5.0 3.4  --   --   HGB 11.4* 10.9* 10.0* 11.0*  HCT 35.8* 33.2* 31.8* 35.0*  MCV 89.7 86.7 89.8 90.2  PLT 269 219 192 232   Cardiac Enzymes:  Recent Labs Lab 12/26/15 2112 12/27/15 0308 12/27/15 0843 12/27/15 1356  TROPONINI 0.03 0.04* 0.03 <0.03   BNP: BNP (last 3 results)  Recent Labs  12/26/15 2112  BNP 42.5   SIGNED: Time coordinating discharge: 30 minutes  Debbora Presto, MD  [facility] 12/30/2015, 12:34 PM Pager [phone]  If 7PM-7AM, please contact night-coverage [email or web address] Password [email or web address]

## 2015-12-30 NOTE — Care Management Note (Signed)
Case Management Note  Patient Details  Name: Michele Cervantes MRN: [id number] Date of Birth: Jul 31, 1929  Subjective/Objective:                    Action/Plan:d/c SNF.   Expected Discharge Date:                  Expected Discharge Plan:  Skilled Nursing Facility  In-House Referral:  Clinical Social Work  Discharge planning Services  CM Consult  Post Acute Care Choice:    Choice offered to:     DME Arranged:    DME Agency:     HH Arranged:    HH Agency:     Status of Service:  Completed, signed off  Medicare Important Message Given:  Yes Date Medicare IM Given:    Medicare IM give by:    Date Additional Medicare IM Given:    Additional Medicare Important Message give by:     If discussed at Long Length of Stay Meetings, dates discussed:    Additional Comments:  Lanier Clam, RN 12/30/2015, 11:46 AM

## 2015-12-30 NOTE — Progress Notes (Signed)
Patient ID: Michele Cervantes, female   DOB: May 08, 1929, 82 y.o.   MRN: [id number]  [facility] PROGRESS NOTE  Michele Cervantes FAO:[id number] DOB: 08-Dec-1928 DOA: 12/26/2015 PCP: Michele Headings, MD  Brief narrative:    82 y.o. female with Alzheimer disease, presented to [facility] ED with somnolence and confusion, was recently discharged from psychiatric unit in [place] and currently lives with family at home. Per family, pt has been bed bound with poor oral intake in the past week and rather lethargic, difficult to awake, leaning towards left side.   In ED, pt noted to be lethargic, CT scan of the head unremarkable for any acute abnormalities ABG showed evidence of hypercarbia. Ammonia level within normal limits Chest x-ray showed new left lower lobe infiltrate with left pleural effusion and cardiomegaly. Pt received broad spectrum abx for pneumonia, currently on Levaquin.   Assessment/Plan:    Principal Problem:   Acute encephalopathy / Dementia with behavioral disturbance - Likely due to progressive FTT, new LLL PNA, dehydration - Treated with broad spectrum abx for pneumonia - Mental status better  - Continue Aricept 10 mg at bedtime   Active Problems:   Aspiration pneumonia of left lower lobe of the lung (HCC) - Completed vanco and zosyn through 2/7 and now on Levaquin  - Continue aspiration precautions     Demand ischemia / Troponin elevation  - Mild elevation in troponin level on admission likely demand ischemia for respiratory infection  - Resolved - No reports of chest pain  - Continue aspirin daily    Dyslipidemia - Continue Pravachol daily     Pulmonary vascular congestion / Acute on chronic diastolic CHF - 2 D ECHO on 2/6 showed EF 60% with grade 1 diastolic dysfunction - Has gotten 1 dose of lasix 20 mg IV - IV fluids stopped - Weight in past 72 hours: 61.2kg --> 66.1 kg --> 67.1 kg - Continue coreg 6.5 mg BID - Continue aspirin daily  - Continue daily weight  and strict intake and output - Replete electrolytes as needed     Essential HTN - Continue coreg 6.25 mg BID    Mild leukopenia - Likely reactive, monitor   DVT prophylaxis  - SCD's bilaterally  - Aspirin daily   Code Status: Full.  Family Communication:  plan of care discussed with the patient Disposition Plan: Anticipate D/C once bed available   IV access:  Peripheral IV  Procedures and diagnostic studies:     Ct Head Wo Contrast 2016-01-12  No acute finding or change since 2016.   Mr Brain Wo Contrast 12-Jan-2016 No acute intracranial abnormality. Aside from cerebral volume loss largely unremarkable noncontrast MRI  Dg Chest Portable 1 View 12/26/2015 New left lower lobe infiltrate and left pleural effusion. 2. Cardiomegaly and mild vascular congestion.  Medical Consultants:  None  Other Consultants:  PT/OT/SLP  IAnti-Infectives:   Vancomycin 2/4 --> 2/7 Zosyn 2/4 --> 2/7  Michele Presto, MD  [facility] Pager [phone]  If 7PM-7AM, please contact night-coverage [email or web address] Password [facility] 12/30/2015, 8:32 AM   LOS: 3 days   HPI/Subjective: No events overnight.   Objective: Filed Vitals:   12/29/15 1356 12/29/15 2142 12/30/15 0144 12/30/15 0629  BP: 116/35 119/44 129/48 123/43  Pulse: 60 62 67 65  Temp: 98.6 F (37 C) 98 F (36.7 C) 99 F (37.2 C) 98.2 F (36.8 C)  TempSrc: Oral Oral Oral Oral  Resp: Height:      Weight:    67.631  kg (149 lb 1.6 oz)  SpO2: 98% 100% 99% 96%    Intake/Output Summary (Last 24 hours) at 12/30/15 9604 Last data filed at 12/30/15 0617  Gross per 24 hour  Intake    360 ml  Output    850 ml  Net   -490 ml    Exam:   General:  Pt is alert, no distress   Cardiovascular: Rate controlled, appreciate S1, S2   Respiratory: diminished, bibasilar crackles, no wheezing   Abdomen: (+) BS, non tender   Data Reviewed: Basic Metabolic Panel:  Recent Labs Lab 12/26/15 2112 12/27/15 0600 12/28/15 0628  12/30/15 0617  NA 137 137 140 139  K 4.2 5.0 3.7 4.3  CL 100* 103 107 103  CO2 GLUCOSE 120* 90 87 90  BUN CREATININE 0.86 0.74 0.76 0.66  CALCIUM 8.5* 8.4* 7.8* 8.0*   Liver Function Tests:  Recent Labs Lab 12/26/15 2112 12/27/15 0600  AST 51* 57*  ALT 41 43  ALKPHOS 88 74  BILITOT 0.4 0.8  PROT 6.8 6.1*  ALBUMIN 3.2* 2.8*    Recent Labs Lab 12/26/15 2112  AMMONIA 26   CBC:  Recent Labs Lab 12/26/15 2112 12/27/15 0843 12/28/15 0628 12/30/15 0617  WBC 6.3 4.8 2.9* 3.6*  NEUTROABS 5.0 3.4  --   --   HGB 11.4* 10.9* 10.0* 11.0*  HCT 35.8* 33.2* 31.8* 35.0*  MCV 89.7 86.7 89.8 90.2  PLT 269 219 192 232   Cardiac Enzymes:  Recent Labs Lab 12/26/15 2112 12/27/15 0308 12/27/15 0843 12/27/15 1356  TROPONINI 0.03 0.04* 0.03 <0.03   Scheduled Meds: . aspirin EC  81 mg Oral Daily  . carvedilol  6.25 mg Oral BID  . divalproex  250 mg Oral BID  . donepezil  10 mg Oral QHS  . levofloxacin  500 mg Oral Daily  . pravastatin  10 mg Oral QHS

## 2015-12-31 ENCOUNTER — Institutional Professional Consult (permissible substitution): Payer: Medicare PPO | Admitting: Neurology

## 2016-03-15 ENCOUNTER — Emergency Department (HOSPITAL_COMMUNITY): Payer: Medicare PPO

## 2016-03-15 ENCOUNTER — Emergency Department (HOSPITAL_COMMUNITY)
Admission: EM | Admit: 2016-03-15 | Discharge: 2016-03-15 | Disposition: A | Discharge status: Home / Self Care | Payer: Medicare PPO | Attending: Emergency Medicine | Admitting: Emergency Medicine

## 2016-03-15 ENCOUNTER — Encounter (HOSPITAL_COMMUNITY): Payer: Self-pay | Admitting: Emergency Medicine

## 2016-03-15 DIAGNOSIS — F0280 Dementia in other diseases classified elsewhere without behavioral disturbance: Secondary | ICD-10-CM | POA: Insufficient documentation

## 2016-03-15 DIAGNOSIS — I209 Angina pectoris, unspecified: Secondary | ICD-10-CM | POA: Insufficient documentation

## 2016-03-15 DIAGNOSIS — R079 Chest pain, unspecified: Secondary | ICD-10-CM | POA: Diagnosis present

## 2016-03-15 DIAGNOSIS — E782 Mixed hyperlipidemia: Secondary | ICD-10-CM | POA: Insufficient documentation

## 2016-03-15 DIAGNOSIS — G309 Alzheimer's disease, unspecified: Secondary | ICD-10-CM | POA: Insufficient documentation

## 2016-03-15 DIAGNOSIS — Z79899 Other long term (current) drug therapy: Secondary | ICD-10-CM | POA: Insufficient documentation

## 2016-03-15 DIAGNOSIS — Z7982 Long term (current) use of aspirin: Secondary | ICD-10-CM | POA: Insufficient documentation

## 2016-03-15 DIAGNOSIS — I10 Essential (primary) hypertension: Secondary | ICD-10-CM | POA: Diagnosis not present

## 2016-03-15 DIAGNOSIS — I509 Heart failure, unspecified: Secondary | ICD-10-CM | POA: Diagnosis not present

## 2016-03-15 LAB — BASIC METABOLIC PANEL
ANION GAP: 8 (ref 5–15)
BUN: 16 mg/dL (ref 6–20)
CHLORIDE: 109 mmol/L (ref 101–111)
CO2: 23 mmol/L (ref 22–32)
Calcium: 8.5 mg/dL — ABNORMAL LOW (ref 8.9–10.3)
Creatinine, Ser: 0.78 mg/dL (ref 0.44–1.00)
Glucose, Bld: 90 mg/dL (ref 65–99)
POTASSIUM: 4 mmol/L (ref 3.5–5.1)
SODIUM: 140 mmol/L (ref 135–145)

## 2016-03-15 LAB — I-STAT TROPONIN, ED
TROPONIN I, POC: 0 ng/mL (ref 0.00–0.08)
Troponin i, poc: 0 ng/mL (ref 0.00–0.08)

## 2016-03-15 LAB — CBC
HEMATOCRIT: 34.7 % — AB (ref 36.0–46.0)
HEMOGLOBIN: 11.1 g/dL — AB (ref 12.0–15.0)
MCH: 28.4 pg (ref 26.0–34.0)
MCHC: 32 g/dL (ref 30.0–36.0)
MCV: 88.7 fL (ref 78.0–100.0)
Platelets: 232 10*3/uL (ref 150–400)
RBC: 3.91 MIL/uL (ref 3.87–5.11)
RDW: 16.2 % — AB (ref 11.5–15.5)
WBC: 9 10*3/uL (ref 4.0–10.5)

## 2016-03-15 LAB — BRAIN NATRIURETIC PEPTIDE: B NATRIURETIC PEPTIDE 5: 87.6 pg/mL (ref 0.0–100.0)

## 2016-03-15 MED ORDER — ASPIRIN 81 MG PO CHEW
324.0000 mg | CHEWABLE_TABLET | Freq: Once | ORAL | Status: AC
  Administered 2016-03-15: 324 mg via ORAL
  Filled 2016-03-15: qty 4

## 2016-03-15 NOTE — ED Notes (Signed)
Pt arrives via POV from home with daughter with chest pain that began this morning during PT session. States PT called her at work to bring her mother to ER for further eval. Per daughter pt had not been c/o chest pain just generalized pain prior to today. Pt awake, alert, oriented x 2, hx of dementia.

## 2016-03-15 NOTE — ED Provider Notes (Signed)
CSN: [id number]     Arrival date & time 03/15/16  1226 History   First MD Initiated Contact with Patient 03/15/16 1500     Chief Complaint  Patient presents with  . Chest Pain   LEVEL 5 CAVEAT - DEMENTIA  (Consider location/radiation/quality/duration/timing/severity/associated sxs/prior Treatment) HPI  82 year old female presents with chest pain that started during physical therapy this morning around 9:30. Patient has a history of dementia and so the history is taken from the daughter at the bedside. Patient does not remember anything about chest pain this morning and denies chest pain now. The daughter states that she was told from someone else that the patient had chest pain during physical therapy but does not know how long or what started it. Has been having generalized aches and pains recently but no recent chest pain since 2014. Daughter states she has a history of angina but has not had anginal pain for over 2 years. There is a diagnosis of CHF but the daughter states she is not on diuretics and is not sure if that is a true diagnosis. Otherwise has not been ill recently. Chronically has mild lower extremity edema that is not worse than typical.  Past Medical History  Diagnosis Date  . Alzheimer disease   . Anginal pain (HCC)   . Hypercholesteremia   . Hypertension   . CHF (congestive heart failure) [facility])    Past Surgical History  Procedure Laterality Date  . Abdominal hysterectomy    . Thyroid lobectomy     Family History  Problem Relation Age of Onset  . CAD Mother   . Heart disease Mother   . Breast cancer Mother   . Dementia Mother   . Alcohol abuse Father   . Cancer Brother   . Dementia Brother    Social History  Substance Use Topics  . Smoking status: Never Smoker   . Smokeless tobacco: Never Used  . Alcohol Use: No   OB History    No data available     Review of Systems  Unable to perform ROS: Dementia      Allergies  Review of patient's allergies  indicates no known allergies.  Home Medications   Prior to Admission medications   Medication Sig Start Date End Date Taking? Authorizing Provider  aspirin 81 MG tablet Take 81 mg by mouth daily.    Historical Provider, MD  carvedilol (COREG) 6.25 MG tablet Take 6.25 mg by mouth 2 (two) times daily. 12/24/15   Historical Provider, MD  celecoxib (CELEBREX) 200 MG capsule Take 200 mg by mouth 2 (two) times daily as needed. Pain 03/03/16   Historical Provider, MD  colchicine 0.6 MG tablet Take 0.6 mg by mouth 2 (two) times daily as needed. for pain 02/18/16   Historical Provider, MD  divalproex (DEPAKOTE) 250 MG DR tablet Take 250 mg by mouth 2 (two) times daily. 12/24/15 12/23/16  Historical Provider, MD  donepezil (ARICEPT) 10 MG tablet Take 10 mg by mouth at bedtime. 10/10/15   Historical Provider, MD  guaiFENesin (ROBITUSSIN) 100 MG/5ML liquid Take 5-10 mLs (100-200 mg total) by mouth every 4 (four) hours as needed for cough. 11/24/15   Arby Barrette, MD  levofloxacin (LEVAQUIN) 500 MG tablet Take 1 tablet (500 mg total) by mouth daily. 12/30/15   Dorothea Ogle, MD  mirtazapine (REMERON) 30 MG tablet Take 30 mg by mouth at bedtime. 12/24/15 01/23/16  Historical Provider, MD  mirtazapine (REMERON) 30 MG tablet Take 30 mg by mouth  daily. 01/28/16   Historical Provider, MD  pravastatin (PRAVACHOL) 10 MG tablet Take 10 mg by mouth at bedtime.  02/21/15   Historical Provider, MD  risperiDONE (RISPERDAL) 1 MG tablet Take 1 mg by mouth at bedtime.  12/24/15   Historical Provider, MD  traZODone (DESYREL) 100 MG tablet Take 100 mg by mouth at bedtime.  12/10/15   Historical Provider, MD   BP 115/42 mmHg  Pulse 59  Temp(Src) 98.4 F (36.9 C) (Oral)  Resp 18  SpO2 95% Physical Exam  Constitutional: She appears well-developed and well-nourished.  HENT:  Head: Normocephalic and atraumatic.  Right Ear: External ear normal.  Left Ear: External ear normal.  Nose: Nose normal.  Eyes: Right eye exhibits no discharge. Left  eye exhibits no discharge.  Cardiovascular: Normal rate, regular rhythm and normal heart sounds.   Pulmonary/Chest: Effort normal. No respiratory distress. She has no wheezes.  Abdominal: Soft. She exhibits no distension. There is no tenderness.  Musculoskeletal: She exhibits edema (trace non pitting edema to ankles bilaterally).  Neurological: She is alert. She is disoriented.  Skin: Skin is warm and dry.  Nursing note and vitals reviewed.   ED Course  Procedures (including critical care time) Labs Review Labs Reviewed  BASIC METABOLIC PANEL - Abnormal; Notable for the following:    Calcium 8.5 (*)    All other components within normal limits  CBC - Abnormal; Notable for the following:    Hemoglobin 11.1 (*)    HCT 34.7 (*)    RDW 16.2 (*)    All other components within normal limits  BRAIN NATRIURETIC PEPTIDE  I-STAT TROPOININ, ED    Imaging Review Dg Chest 2 View  03/15/2016  CLINICAL DATA:  Chest pain EXAM: CHEST  2 VIEW COMPARISON:  12/26/2015 FINDINGS: Cardiac enlargement is identified. Aortic atherosclerosis. Decreased lung volumes. Small pleural effusions and bibasilar atelectasis noted. There is low lung volumes. IMPRESSION: 1. Decreased lung volumes. 2. Small pleural effusions and bibasilar atelectasis. Electronically Signed   By: Signa Kell M.D.   On: 03/15/2016 13:11   I have personally reviewed and evaluated these images and lab results as part of my medical decision-making.   EKG Interpretation   Date/Time:  Tuesday March 15 2016 12:32:43 EDT Ventricular Rate:  69 PR Interval:  170 QRS Duration: 88 QT Interval:  366 QTC Calculation: 392 R Axis:   62 Text Interpretation:  Normal sinus rhythm Nonspecific T wave abnormality  Abnormal ECG no significant change since Feb 2017 Confirmed by Criss Alvine   MD, Hailey Miles [phone]) on 03/15/2016 3:00:49 PM       EKG Interpretation  Date/Time:  Tuesday March 15 2016 15:33:44 EDT Ventricular Rate:  64 PR  Interval:  160 QRS Duration: 86 QT Interval:  390 QTC Calculation: 402 R Axis:   69 Text Interpretation:  Normal sinus rhythm Nonspecific T wave abnormality Abnormal ECG no significant change since earlier in the day Confirmed by Hakan Nudelman  MD, Sebastiana Wuest ([id number]) on 03/15/2016 3:38:10 PM       MDM   Final diagnoses:  Chest pain, unspecified chest pain type    Daughter called her brother and found out that the chest pain occurred while she was getting out of a chair for PT. Was "brief". No chest pain since and none now. Small pleural effusions but no signs of CHF with negative BNP. Does not appear ill. No respiratory distress or shortness of breath. Unclear exactly what has happened given her dementia. Discussed options with daughter. She  is the primary decision maker and at this point would not want to pursue aggressive treatment such as a cath or other aggressive measures. After discussion of risks/benefits of observation admission versus going home, daughter wants to take patient home. I think this is very reasonable. Discussed importance of returning if symptoms worsen as well as importance of follow-up. I have very low suspicion for PE or dissection given very transient nature of symptoms.    Pricilla LovelessScott Herschell Virani, MD 03/15/16 [phone]

## 2016-04-20 ENCOUNTER — Ambulatory Visit (INDEPENDENT_AMBULATORY_CARE_PROVIDER_SITE_OTHER): Payer: Medicare PPO | Admitting: Adult Health

## 2016-04-20 ENCOUNTER — Encounter: Payer: Self-pay | Admitting: Adult Health

## 2016-04-20 VITALS — BP 134/62 | HR 78 | Resp 14 | Ht 61.0 in | Wt 155.0 lb

## 2016-04-20 DIAGNOSIS — F0391 Unspecified dementia with behavioral disturbance: Secondary | ICD-10-CM

## 2016-04-20 DIAGNOSIS — R5383 Other fatigue: Secondary | ICD-10-CM

## 2016-04-20 NOTE — Patient Instructions (Addendum)
Look at home to see if she on Mirtazapine? Sertraline? And dosing? As well as trazodone? Continue Aricept Some medication may need to be adjusted due to lethargy. If your symptoms worsen or you develop new symptoms please let us know.

## 2016-04-20 NOTE — Progress Notes (Signed)
I have read the note, and I agree with the clinical assessment and plan.  WILLIS,CHARLES KEITH   

## 2016-04-20 NOTE — Progress Notes (Signed)
PATIENT: Michele Cervantes DOB: 20-Sep-1929  REASON FOR VISIT: follow up- dementia with behavioral disorder HISTORY FROM: patient  HISTORY OF PRESENT ILLNESS: Michele Cervantes is a 82 year old female with a history of dementia associated with a behavior disorder. She returns today for follow-up. At the last visit the patient was increased on Zoloft at 100 mg daily as well as trazodone 200 mg at bedtime for sleep. However the family member did not bring her medications with her and is unsure what medication she is taking as well as the dosing. She does report that the patient has been more lethargic. However her agitation has been under good control. She states that the patient sleeps most of the day but will arouse when her name is called. She states that her appetite has also decreased. She states that the patient is usually on 3-4 times at night. The patient lives at home with her daughter and son. She does require assistance with ADLs. She does not operate a motor vehicle. She returns today for an evaluation.  HISTORY 10/20/15 (WILLIS): Michele Cervantes is an 82 year old right-handed black female with a history of dementia associated with a behavior disorder. The patient lives with her family, her daughter is around in the evening, her son is around during the daytime. The patient has an inversion of the day-night cycle, the patient has difficulty sleeping at night, but she sleeps until about noon the next day. The patient may become agitated at times, she has had 3 emergency room visits because of agitation since last seen. The patient was seen in the emergency room on 15 August, 27 August, and 21 November. The patient has been placed on low-dose Risperdal, she has been taken off of Ambien and placed on trazodone for sleep. This is not very effective for sleep, however. The patient is on Aricept taking 10 mg at night. She is also on Zoloft 50 mg daily. She is maintaining her appetite, and no significant  weight loss is noted. The patient has lost 2 pounds since last seen. The patient admits to having problems with getting upset.  REVIEW OF SYSTEMS: Out of a complete 14 system review of symptoms, the patient complains only of the following symptoms, and all other reviewed systems are negative.  Appetite change, runny nose, cold intolerance, joint pain, walking difficulty, daytime sleepiness, confusion, facial drooping, tremors, memory loss  ALLERGIES: No Known Allergies  HOME MEDICATIONS: Outpatient Prescriptions Prior to Visit  Medication Sig Dispense Refill  . aspirin 81 MG tablet Take 81 mg by mouth daily.    . carvedilol (COREG) 6.25 MG tablet Take 6.25 mg by mouth 2 (two) times daily.    . celecoxib (CELEBREX) 200 MG capsule Take 200 mg by mouth 2 (two) times daily as needed. Pain  2  . colchicine 0.6 MG tablet Take 0.6 mg by mouth 2 (two) times daily as needed. for pain  1  . divalproex (DEPAKOTE) 250 MG DR tablet Take 250 mg by mouth 2 (two) times daily.    Marland Kitchen donepezil (ARICEPT) 10 MG tablet Take 10 mg by mouth at bedtime.    . mirtazapine (REMERON) 30 MG tablet Take 30 mg by mouth at bedtime.    . mirtazapine (REMERON) 30 MG tablet Take 30 mg by mouth daily.    . pravastatin (PRAVACHOL) 10 MG tablet Take 10 mg by mouth at bedtime.   1  . risperiDONE (RISPERDAL) 1 MG tablet Take 1 mg by mouth at bedtime.     Marland Kitchen  traZODone (DESYREL) 100 MG tablet Take 100 mg by mouth at bedtime.      No facility-administered medications prior to visit.    PAST MEDICAL HISTORY: Past Medical History  Diagnosis Date  . Alzheimer disease   . Anginal pain (HCC)   . Hypercholesteremia   . Hypertension   . CHF (congestive heart failure) (HCC)     PAST SURGICAL HISTORY: Past Surgical History  Procedure Laterality Date  . Abdominal hysterectomy    . Thyroid lobectomy      FAMILY HISTORY: Family History  Problem Relation Age of Onset  . CAD Mother   . Heart disease Mother   . Breast cancer  Mother   . Dementia Mother   . Alcohol abuse Father   . Cancer Brother   . Dementia Brother     SOCIAL HISTORY: Social History   Social History  . Marital Status: Widowed    Spouse Name: N/A  . Number of Children: 4  . Years of Education: some coll.   Occupational History  . retired    Social History Main Topics  . Smoking status: Never Smoker   . Smokeless tobacco: Never Used  . Alcohol Use: No  . Drug Use: No  . Sexual Activity: Not on file   Other Topics Concern  . Not on file   Social History Narrative   Patient does not drink caffeine.   Patient is right handed.      PHYSICAL EXAM  Filed Vitals:   04/20/16 0851  BP: 134/62  Pulse: 78  Resp: 14  Height: 5\' 1"  (1.549 m)  Weight: 155 lb (70.308 kg)   Body mass index is 29.3 kg/(m^2).   MMSE - Mini Mental State Exam 04/20/2016 10/20/2015 06/17/2015  Orientation to time 1 1 1   Orientation to Place 1 2 2   Registration 0 3 3  Attention/ Calculation 0 0 2  Recall 0 0 0  Language- name 2 objects 2 2 2   Language- repeat 0 1 1  Language- follow 3 step command 1 3 3   Language- read & follow direction 0 1 1  Write a sentence 1 1 1   Copy design 1 1 1   Total score 7 15 17      Generalized: Well developed, in no acute distress   Neurological examination  Mentation:  Drowsy. Follows all commands speech and language fluent Cranial nerve II-XII: Pupils were equal round reactive to light. Extraocular movements were full, visual field were full on confrontational test. Facial sensation and strength were normal. Uvula tongue midline. Head turning and shoulder shrug  were normal and symmetric. Motor: The motor testing reveals 5 over 5 strength of all 4 extremities. Weakness in the left wrist- this is been ongoing according to family. Family also reports that she had several tests to evaluate the weakness. Good symmetric motor tone is noted throughout.  Sensory: Sensory testing is intact to soft touch on all 4  extremities. No evidence of extinction is noted.  Coordination: Cerebellar testing reveals good finger-nose-finger and heel-to-shin bilaterally.  Gait and station: Patient uses a walker when ambulating. Gait is unsteady. Tandem gait not attempted.  Reflexes: Deep tendon reflexes are symmetric but depressed.  DIAGNOSTIC DATA (LABS, IMAGING, TESTING) - I reviewed patient records, labs, notes, testing and imaging myself where available.  Lab Results  Component Value Date   WBC 9.0 03/15/2016   HGB 11.1* 03/15/2016   HCT 34.7* 03/15/2016   MCV 88.7 03/15/2016   PLT 232 03/15/2016  Component Value Date/Time   NA 140 03/15/2016 1239   K 4.0 03/15/2016 1239   CL 109 03/15/2016 1239   CO2 23 03/15/2016 1239   GLUCOSE 90 03/15/2016 1239   BUN 16 03/15/2016 1239   CREATININE 0.78 03/15/2016 1239   CALCIUM 8.5* 03/15/2016 1239   PROT 6.1* 12/27/2015 0600   ALBUMIN 2.8* 12/27/2015 0600   AST 57* 12/27/2015 0600   ALT 43 12/27/2015 0600   ALKPHOS 74 12/27/2015 0600   BILITOT 0.8 12/27/2015 0600   GFRNONAA >60 03/15/2016 1239   GFRAA >60 03/15/2016 1239      ASSESSMENT AND PLAN 82 y.o. year old female  has a past medical history of Alzheimer disease; Anginal pain (HCC); Hypercholesteremia; Hypertension; and CHF (congestive heart failure) (HCC). here with:     1. Dementia with behavior disturbance  [name] memory score has declined however this could be due to lethargy. The patient's daughter is unsure what medication she is on. I have asked the daughter to call when she gets home so we can update her medication list. At that time we will probably need to adjust some of her medications to improve lethargy. She will continue on Aricept for her memory. She will follow-up in 3 months or sooner if needed.     Butch PennyMegan Jahari Billy, MSN, NP-C 04/20/2016, 8:34 AM [facility] [address], [address] [place], [place] [address] [phone]

## 2016-05-20 ENCOUNTER — Other Ambulatory Visit: Payer: Self-pay | Admitting: Adult Health

## 2016-05-20 NOTE — Telephone Encounter (Signed)
Patient requesting refill of mirtazapine (REMERON) 30 MG tablet  And divalproex (DEPAKOTE) 250 MG DR tablet Pharmacy: [id number] - [place], [place] - [address]   Pt is out of medication

## 2016-05-23 MED ORDER — MIRTAZAPINE 30 MG PO TABS: 30.0000 mg | ORAL_TABLET | Freq: Every day | ORAL | Status: DC

## 2016-05-23 MED ORDER — DIVALPROEX SODIUM 250 MG PO DR TAB: 250.0000 mg | DELAYED_RELEASE_TABLET | Freq: Two times a day (BID) | ORAL | Status: DC

## 2016-05-23 NOTE — Telephone Encounter (Signed)
I called the daughter, the patient has been placed on these medications including the mirtazapine and Depakote by Dr. Katrinka BlazingSmith who is not really following her. These medications have helped her. I will call in these prescriptions.

## 2016-05-23 NOTE — Telephone Encounter (Signed)
Pt had been seen in [facility] and was prescribed the meds by Lowanda FosterBeverly Jones, MD when in the hospital there.  I called Dr. Yetta BarreJones office and they have not seen pt in f/u at outpt.  [place] now.    She has seen pcp, Dr. Thea SilversmithMackenzie 03/31/16 and was taking these medications.   Do you want to prescribe?

## 2016-05-23 NOTE — Telephone Encounter (Signed)
LMVM for Carlyle BasquesSandra Williams, daughter to call me back relating to medications pt is taking (from last time in the office 04-19-16.  Pt was lethargic.

## 2016-05-23 NOTE — Telephone Encounter (Signed)
LMVM for daughter to call back about medications. I called the home (which is daughters work #) and got her there.  Needing remeron and depakote (last dose of meds today she says).   I called pharmacy and they  have Lowanda FosterBeverly Jones, MD?at [facility] who prescribed these.  She was there for behavioral issues ( from [facility] to [facility]).   Pt now at home.   MM/NP out.   Would you prescribe?  Please advise.   She stated that pt is somewhat better relating to lethargy and is now waking several times a night.   She has caregiver with her during day.

## 2016-05-30 ENCOUNTER — Other Ambulatory Visit: Payer: Self-pay | Admitting: Neurology

## 2016-06-23 ENCOUNTER — Other Ambulatory Visit: Payer: Self-pay

## 2016-06-23 NOTE — Patient Outreach (Signed)
[facility]) Care Management  06/23/2016  Michele Cervantes 07/13/1929 [id number]   Telephone call to daughter Dois Davenport who is patient's guardian.  She states that patient is doing good.  She continues to have some behavioral disturbances but is doing better.  She states that patient is in a day program that is 4 days a week and this has helped tremendously with patient.  She states it gives patient some interaction with others and gets patient out of the house.  Daughter reports that she has not care management needs at this time but is eager for [facility] Care Management information as a resource in the future.  Advised that health coach would send letter and brochure for future reference.    Plan: RN Health Coach will send letter and brochure.    Bary Leriche, RN, MSN [facility] Care Management RN Telephonic Health Coach [phone]

## 2016-07-20 ENCOUNTER — Ambulatory Visit: Payer: Medicare PPO | Admitting: Nurse Practitioner

## 2016-07-20 ENCOUNTER — Encounter: Payer: Self-pay | Admitting: Adult Health

## 2016-07-20 ENCOUNTER — Ambulatory Visit (INDEPENDENT_AMBULATORY_CARE_PROVIDER_SITE_OTHER): Payer: Medicare PPO | Admitting: Adult Health

## 2016-07-20 VITALS — BP 103/57 | HR 67 | Ht 61.0 in | Wt 151.6 lb

## 2016-07-20 DIAGNOSIS — F0391 Unspecified dementia with behavioral disturbance: Secondary | ICD-10-CM | POA: Diagnosis not present

## 2016-07-20 NOTE — Progress Notes (Signed)
I have read the note, and I agree with the clinical assessment and plan.  Mackinzee Roszak KEITH   

## 2016-07-20 NOTE — Progress Notes (Signed)
PATIENT: Michele Cervantes DOB: 07/28/1929  REASON FOR VISIT: follow up- dementia with behavior disorder HISTORY FROM: patient  HISTORY OF PRESENT ILLNESS: Michele Cervantes is an 82 year old female with a history of dementia with behavior disorder. She returns today for follow-up. Her daughter is with her today. She reports that the patient is doing better since the last visit. She states that she is more alert during the day since she began the day program. Denies any problems with her behavior. Reports that she is sleeping great at night. Denies any hallucinations. Reports occasionally she will wake up disoriented but is able to return to her baseline. She continues to live with her daughter. She does require assistance with ADLs. She does not operate a motor vehicle. Her daughter handles her finances. She continues on Depakote, mirtazapine, and trazodone. Reports that her primary care recently placed her on Namzaric. She feels that she is doing better since starting this medication. Returns today for an evaluation.  HISTORY 04/20/16: Michele Cervantes is a 82 year old female with a history of dementia associated with a behavior disorder. She returns today for follow-up. At the last visit the patient was increased on Zoloft at 100 mg daily as well as trazodone 200 mg at bedtime for sleep. However the family member did not bring her medications with her and is unsure what medication she is taking as well as the dosing. She does report that the patient has been more lethargic. However her agitation has been under good control. She states that the patient sleeps most of the day but will arouse when her name is called. She states that her appetite has also decreased. She states that the patient is usually on 3-4 times at night. The patient lives at home with her daughter and son. She does require assistance with ADLs. She does not operate a motor vehicle. She returns today for an evaluation.  HISTORY 10/20/15  (WILLIS): Michele Cervantes is an 82 year old right-handed black female with a history of dementia associated with a behavior disorder. The patient lives with her family, her daughter is around in the evening, her son is around during the daytime. The patient has an inversion of the day-night cycle, the patient has difficulty sleeping at night, but she sleeps until about noon the next day. The patient may become agitated at times, she has had 3 emergency room visits because of agitation since last seen. The patient was seen in the emergency room on 15 August, 27 August, and 21 November. The patient has been placed on low-dose Risperdal, she has been taken off of Ambien and placed on trazodone for sleep. This is not very effective for sleep, however. The patient is on Aricept taking 10 mg at night. She is also on Zoloft 50 mg daily. She is maintaining her appetite, and no significant weight loss is noted. The patient has lost 2 pounds since last seen. The patient admits to having problems with getting upset.  REVIEW OF SYSTEMS: Out of a complete 14 system review of symptoms, the patient complains only of the following symptoms, and all other reviewed systems are negative.  Constipation, tremors, memory loss, cold intolerance, eye discharge, runny nose  ALLERGIES: No Known Allergies  HOME MEDICATIONS: Outpatient Medications Prior to Visit  Medication Sig Dispense Refill  . aspirin 81 MG tablet Take 81 mg by mouth daily.    . carvedilol (COREG) 6.25 MG tablet Take 6.25 mg by mouth 2 (two) times daily.    . celecoxib (CELEBREX)  200 MG capsule Take 200 mg by mouth 2 (two) times daily as needed. Pain  2  . divalproex (DEPAKOTE) 250 MG DR tablet Take 1 tablet (250 mg total) by mouth 2 (two) times daily. 60 tablet 3  . donepezil (ARICEPT) 10 MG tablet Take 10 mg by mouth at bedtime.    . mirtazapine (REMERON) 30 MG tablet Take 1 tablet (30 mg total) by mouth daily. 30 tablet 3  . pravastatin (PRAVACHOL) 10 MG  tablet Take 10 mg by mouth at bedtime.   1  . risperiDONE (RISPERDAL) 1 MG tablet Take 1 mg by mouth at bedtime.     . traZODone (DESYREL) 100 MG tablet TAKE 1 TABLET (100 MG TOTAL) BY MOUTH AT BEDTIME. 90 tablet 0  . traZODone (DESYREL) 100 MG tablet Take 100 mg by mouth at bedtime.      No facility-administered medications prior to visit.     PAST MEDICAL HISTORY: Past Medical History:  Diagnosis Date  . Alzheimer disease   . Anginal pain (HCC)   . CHF (congestive heart failure) (HCC)   . Hypercholesteremia   . Hypertension     PAST SURGICAL HISTORY: Past Surgical History:  Procedure Laterality Date  . ABDOMINAL HYSTERECTOMY    . THYROID LOBECTOMY      FAMILY HISTORY: Family History  Problem Relation Age of Onset  . CAD Mother   . Heart disease Mother   . Breast cancer Mother   . Dementia Mother   . Alcohol abuse Father   . Cancer Brother   . Dementia Brother     SOCIAL HISTORY: Social History   Social History  . Marital status: Widowed    Spouse name: N/A  . Number of children: 4  . Years of education: some coll.   Occupational History  . retired    Social History Main Topics  . Smoking status: Never Smoker  . Smokeless tobacco: Never Used  . Alcohol use No  . Drug use: No  . Sexual activity: Not on file   Other Topics Concern  . Not on file   Social History Narrative   Patient does not drink caffeine.   Patient is right handed.      PHYSICAL EXAM  Vitals:   07/20/16 0806  BP: (!) 103/57  Pulse: 67  Weight: 151 lb 9.6 oz (68.8 kg)  Height: 5\' 1"  (1.549 m)   Body mass index is 28.64 kg/m.   MMSE - Mini Mental State Exam 07/20/2016 04/20/2016 10/20/2015  Orientation to time 0 1 1  Orientation to Place 2 1 2   Registration 3 0 3  Attention/ Calculation 2 0 0  Recall 0 0 0  Language- name 2 objects 2 2 2   Language- repeat 0 0 1  Language- follow 3 step command 3 1 3   Language- read & follow direction 1 0 1  Write a sentence 1 1 1     Copy design 0 1 1  Total score 14 7 15      Generalized: Well developed, in no acute distress   Neurological examination  Mentation: Alert. Follows all commands speech and language fluent Cranial nerve II-XII: Pupils were equal round reactive to light. Extraocular movements were full, visual field were full on confrontational test. Facial sensation and strength were normal. Uvula tongue midline. Head turning and shoulder shrug  were normal and symmetric. Motor: The motor testing reveals 5 over 5 strength of all 4 extremities. Good symmetric motor tone is noted throughout.  Sensory:  Sensory testing is intact to soft touch on all 4 extremities. No evidence of extinction is noted.  Coordination:  Cerebellar testing reveals good finger-nose-finger and heel-to-shin bilaterally.  Gait and station: Uses a walker when ambulating. Reflexes: Deep tendon reflexes are symmetric and normal bilaterally.   DIAGNOSTIC DATA (LABS, IMAGING, TESTING) - I reviewed patient records, labs, notes, testing and imaging myself where available.  Lab Results  Component Value Date   WBC 9.0 03/15/2016   HGB 11.1 (L) 03/15/2016   HCT 34.7 (L) 03/15/2016   MCV 88.7 03/15/2016   PLT 232 03/15/2016      Component Value Date/Time   NA 140 03/15/2016 1239   K 4.0 03/15/2016 1239   CL 109 03/15/2016 1239   CO2 23 03/15/2016 1239   GLUCOSE 90 03/15/2016 1239   BUN 16 03/15/2016 1239   CREATININE 0.78 03/15/2016 1239   CALCIUM 8.5 (L) 03/15/2016 1239   PROT 6.1 (L) 12/27/2015 0600   ALBUMIN 2.8 (L) 12/27/2015 0600   AST 57 (H) 12/27/2015 0600   ALT 43 12/27/2015 0600   ALKPHOS 74 12/27/2015 0600   BILITOT 0.8 12/27/2015 0600   GFRNONAA >60 03/15/2016 1239   GFRAA >60 03/15/2016 1239      ASSESSMENT AND PLAN 82 y.o. year old female  has a past medical history of Alzheimer disease; Anginal pain (HCC); CHF (congestive heart failure) (HCC); Hypercholesteremia; and Hypertension. here with:  1.  Dementia  Overall the patient is doing better. Her MMSE today is 14 out of 30. At this time we will not make any adjustments to her medication. Advised the patient and her daughter if her symptoms worsen or she develops any new symptoms that should let me know. Will follow-up in 6 months or sooner if needed.     Butch Penny, MSN, NP-C 07/20/2016, 8:17 AM [facility] [address] [place], [place] [address] [phone]

## 2016-07-20 NOTE — Patient Instructions (Signed)
Memory score has improved Continue current medications If your symptoms worsen or you develop new symptoms please let us know.

## 2016-07-26 ENCOUNTER — Telehealth: Payer: Self-pay | Admitting: Adult Health

## 2016-07-26 NOTE — Telephone Encounter (Signed)
Noted.  Namzeric is in pt's med list/fim

## 2016-07-26 NOTE — Telephone Encounter (Signed)
Pt's daughter called to let Aundra MilletMegan know pt is taking namzaric : memadtin HCI XR donepezil HCI capsules 10 mg.

## 2016-09-03 ENCOUNTER — Other Ambulatory Visit: Payer: Self-pay | Admitting: Neurology

## 2016-09-07 ENCOUNTER — Other Ambulatory Visit: Payer: Self-pay | Admitting: Neurology

## 2016-09-17 ENCOUNTER — Other Ambulatory Visit (INDEPENDENT_AMBULATORY_CARE_PROVIDER_SITE_OTHER): Payer: Self-pay | Admitting: Orthopaedic Surgery

## 2016-09-23 ENCOUNTER — Encounter (HOSPITAL_COMMUNITY): Payer: Self-pay

## 2016-09-23 ENCOUNTER — Emergency Department (HOSPITAL_COMMUNITY): Payer: Medicare PPO

## 2016-09-23 ENCOUNTER — Inpatient Hospital Stay (HOSPITAL_COMMUNITY)
Admission: EM | Admit: 2016-09-23 | Discharge: 2016-09-26 | DRG: 291 | Disposition: A | Discharge status: Home Health | Payer: Medicare PPO | Attending: Internal Medicine | Admitting: Internal Medicine

## 2016-09-23 DIAGNOSIS — I110 Hypertensive heart disease with heart failure: Principal | ICD-10-CM | POA: Diagnosis present

## 2016-09-23 DIAGNOSIS — Z79899 Other long term (current) drug therapy: Secondary | ICD-10-CM

## 2016-09-23 DIAGNOSIS — R4182 Altered mental status, unspecified: Secondary | ICD-10-CM | POA: Diagnosis present

## 2016-09-23 DIAGNOSIS — R5383 Other fatigue: Secondary | ICD-10-CM

## 2016-09-23 DIAGNOSIS — R400 Somnolence: Secondary | ICD-10-CM | POA: Diagnosis not present

## 2016-09-23 DIAGNOSIS — F0281 Dementia in other diseases classified elsewhere with behavioral disturbance: Secondary | ICD-10-CM

## 2016-09-23 DIAGNOSIS — G309 Alzheimer's disease, unspecified: Secondary | ICD-10-CM | POA: Diagnosis present

## 2016-09-23 DIAGNOSIS — Z7409 Other reduced mobility: Secondary | ICD-10-CM

## 2016-09-23 DIAGNOSIS — F0280 Dementia in other diseases classified elsewhere without behavioral disturbance: Secondary | ICD-10-CM | POA: Diagnosis present

## 2016-09-23 DIAGNOSIS — J9811 Atelectasis: Secondary | ICD-10-CM | POA: Diagnosis present

## 2016-09-23 DIAGNOSIS — L89612 Pressure ulcer of right heel, stage 2: Secondary | ICD-10-CM | POA: Diagnosis present

## 2016-09-23 DIAGNOSIS — I5033 Acute on chronic diastolic (congestive) heart failure: Secondary | ICD-10-CM | POA: Diagnosis not present

## 2016-09-23 DIAGNOSIS — Z7982 Long term (current) use of aspirin: Secondary | ICD-10-CM

## 2016-09-23 DIAGNOSIS — R627 Adult failure to thrive: Secondary | ICD-10-CM | POA: Diagnosis present

## 2016-09-23 DIAGNOSIS — L89002 Pressure ulcer of unspecified elbow, stage 2: Secondary | ICD-10-CM

## 2016-09-23 DIAGNOSIS — E7800 Pure hypercholesterolemia, unspecified: Secondary | ICD-10-CM | POA: Diagnosis present

## 2016-09-23 DIAGNOSIS — G9341 Metabolic encephalopathy: Secondary | ICD-10-CM | POA: Diagnosis present

## 2016-09-23 DIAGNOSIS — F02818 Dementia in other diseases classified elsewhere, unspecified severity, with other behavioral disturbance: Secondary | ICD-10-CM

## 2016-09-23 LAB — CBC WITH DIFFERENTIAL/PLATELET
BASOS PCT: 0 %
Basophils Absolute: 0 10*3/uL (ref 0.0–0.1)
EOS ABS: 0.2 10*3/uL (ref 0.0–0.7)
EOS PCT: 3 %
HEMATOCRIT: 33.3 % — AB (ref 36.0–46.0)
Hemoglobin: 10.9 g/dL — ABNORMAL LOW (ref 12.0–15.0)
Lymphocytes Relative: 31 %
Lymphs Abs: 1.8 10*3/uL (ref 0.7–4.0)
MCH: 29.9 pg (ref 26.0–34.0)
MCHC: 32.7 g/dL (ref 30.0–36.0)
MCV: 91.2 fL (ref 78.0–100.0)
MONO ABS: 0.7 10*3/uL (ref 0.1–1.0)
MONOS PCT: 12 %
Neutro Abs: 3.1 10*3/uL (ref 1.7–7.7)
Neutrophils Relative %: 54 %
Platelets: 266 10*3/uL (ref 150–400)
RBC: 3.65 MIL/uL — ABNORMAL LOW (ref 3.87–5.11)
RDW: 14.1 % (ref 11.5–15.5)
WBC: 5.9 10*3/uL (ref 4.0–10.5)

## 2016-09-23 LAB — COMPREHENSIVE METABOLIC PANEL
ALK PHOS: 60 U/L (ref 38–126)
ALT: 9 U/L — AB (ref 14–54)
AST: 14 U/L — AB (ref 15–41)
Albumin: 2.9 g/dL — ABNORMAL LOW (ref 3.5–5.0)
Anion gap: 4 — ABNORMAL LOW (ref 5–15)
BILIRUBIN TOTAL: 0.5 mg/dL (ref 0.3–1.2)
BUN: 23 mg/dL — AB (ref 6–20)
CALCIUM: 8.5 mg/dL — AB (ref 8.9–10.3)
CO2: 29 mmol/L (ref 22–32)
CREATININE: 0.91 mg/dL (ref 0.44–1.00)
Chloride: 104 mmol/L (ref 101–111)
GFR, EST NON AFRICAN AMERICAN: 56 mL/min — AB (ref >60)
Glucose, Bld: 86 mg/dL (ref 65–99)
Potassium: 4.2 mmol/L (ref 3.5–5.1)
Sodium: 137 mmol/L (ref 135–145)
TOTAL PROTEIN: 6.1 g/dL — AB (ref 6.5–8.1)

## 2016-09-23 LAB — URINALYSIS, ROUTINE W REFLEX MICROSCOPIC
BILIRUBIN URINE: NEGATIVE
Glucose, UA: NEGATIVE mg/dL
Hgb urine dipstick: NEGATIVE
Ketones, ur: NEGATIVE mg/dL
Leukocytes, UA: NEGATIVE
NITRITE: NEGATIVE
PH: 7.5 (ref 5.0–8.0)
Protein, ur: NEGATIVE mg/dL
SPECIFIC GRAVITY, URINE: 1.011 (ref 1.005–1.030)

## 2016-09-23 LAB — I-STAT CHEM 8, ED
BUN: 22 mg/dL — AB (ref 6–20)
CALCIUM ION: 1.16 mmol/L (ref 1.15–1.40)
CHLORIDE: 100 mmol/L — AB (ref 101–111)
Creatinine, Ser: 1 mg/dL (ref 0.44–1.00)
Glucose, Bld: 83 mg/dL (ref 65–99)
HEMATOCRIT: 32 % — AB (ref 36.0–46.0)
Hemoglobin: 10.9 g/dL — ABNORMAL LOW (ref 12.0–15.0)
Potassium: 4.2 mmol/L (ref 3.5–5.1)
SODIUM: 140 mmol/L (ref 135–145)
TCO2: 28 mmol/L (ref 0–100)

## 2016-09-23 LAB — I-STAT CG4 LACTIC ACID, ED
LACTIC ACID, VENOUS: 1.1 mmol/L (ref 0.5–1.9)
Lactic Acid, Venous: 0.67 mmol/L (ref 0.5–1.9)

## 2016-09-23 LAB — CBG MONITORING, ED: Glucose-Capillary: 81 mg/dL (ref 65–99)

## 2016-09-23 LAB — INFLUENZA PANEL BY PCR (TYPE A & B)
INFLAPCR: NEGATIVE
INFLBPCR: NEGATIVE

## 2016-09-23 LAB — TSH
TSH: 2.176 u[IU]/mL (ref 0.350–4.500)
TSH: 2.371 u[IU]/mL (ref 0.350–4.500)

## 2016-09-23 LAB — PROTIME-INR
INR: 1.13
Prothrombin Time: 14.5 seconds (ref 11.4–15.2)

## 2016-09-23 MED ORDER — VANCOMYCIN HCL 10 G IV SOLR
1250.0000 mg | Freq: Once | INTRAVENOUS | Status: DC
  Administered 2016-09-23: 1250 mg via INTRAVENOUS
  Filled 2016-09-23: qty 1250

## 2016-09-23 MED ORDER — PIPERACILLIN-TAZOBACTAM 3.375 G IVPB 30 MIN
3.3750 g | Freq: Once | INTRAVENOUS | Status: AC
  Administered 2016-09-23: 3.375 g via INTRAVENOUS
  Filled 2016-09-23: qty 50

## 2016-09-23 MED ORDER — ONDANSETRON HCL 4 MG PO TABS: 4.0000 mg | ORAL_TABLET | Freq: Four times a day (QID) | ORAL | Status: DC | PRN

## 2016-09-23 MED ORDER — CARVEDILOL 6.25 MG PO TABS
6.2500 mg | ORAL_TABLET | Freq: Two times a day (BID) | ORAL | Status: DC
  Administered 2016-09-23 – 2016-09-26 (×7): 6.25 mg via ORAL
  Filled 2016-09-23 (×7): qty 1

## 2016-09-23 MED ORDER — ASPIRIN EC 81 MG PO TBEC
81.0000 mg | DELAYED_RELEASE_TABLET | Freq: Every day | ORAL | Status: DC
Start: 2016-09-23 — End: 2016-09-26
  Administered 2016-09-23 – 2016-09-26 (×4): 81 mg via ORAL
  Filled 2016-09-23 (×4): qty 1

## 2016-09-23 MED ORDER — TRAZODONE HCL 50 MG PO TABS
50.0000 mg | ORAL_TABLET | Freq: Every day | ORAL | Status: DC
  Administered 2016-09-23 – 2016-09-25 (×3): 50 mg via ORAL
  Filled 2016-09-23 (×3): qty 1

## 2016-09-23 MED ORDER — ACETAMINOPHEN 650 MG RE SUPP: 650.0000 mg | Freq: Four times a day (QID) | RECTAL | Status: DC | PRN

## 2016-09-23 MED ORDER — HYDROCODONE-ACETAMINOPHEN 5-325 MG PO TABS: 1.0000 | ORAL_TABLET | ORAL | Status: DC | PRN

## 2016-09-23 MED ORDER — ACETAMINOPHEN 325 MG PO TABS: 650.0000 mg | ORAL_TABLET | Freq: Four times a day (QID) | ORAL | Status: DC | PRN

## 2016-09-23 MED ORDER — SODIUM CHLORIDE 0.9 % IV BOLUS (SEPSIS)
1000.0000 mL | Freq: Once | INTRAVENOUS | Status: AC
  Administered 2016-09-23: 1000 mL via INTRAVENOUS

## 2016-09-23 MED ORDER — HEPARIN SODIUM (PORCINE) 5000 UNIT/ML IJ SOLN
5000.0000 [IU] | Freq: Three times a day (TID) | INTRAMUSCULAR | Status: DC
  Administered 2016-09-23 – 2016-09-26 (×10): 5000 [IU] via SUBCUTANEOUS
  Filled 2016-09-23 (×10): qty 1

## 2016-09-23 MED ORDER — RISPERIDONE 1 MG PO TABS
1.0000 mg | ORAL_TABLET | Freq: Two times a day (BID) | ORAL | Status: DC
  Administered 2016-09-23 – 2016-09-26 (×7): 1 mg via ORAL
  Filled 2016-09-23 (×7): qty 1

## 2016-09-23 MED ORDER — PRAVASTATIN SODIUM 20 MG PO TABS
10.0000 mg | ORAL_TABLET | Freq: Every day | ORAL | Status: DC
  Administered 2016-09-23 – 2016-09-25 (×3): 10 mg via ORAL
  Filled 2016-09-23 (×3): qty 1

## 2016-09-23 MED ORDER — SODIUM CHLORIDE 0.9% FLUSH
3.0000 mL | Freq: Two times a day (BID) | INTRAVENOUS | Status: DC
  Administered 2016-09-23 – 2016-09-25 (×6): 3 mL via INTRAVENOUS

## 2016-09-23 MED ORDER — ONDANSETRON HCL 4 MG/2ML IJ SOLN: 4.0000 mg | Freq: Four times a day (QID) | INTRAMUSCULAR | Status: DC | PRN

## 2016-09-23 MED ORDER — DIVALPROEX SODIUM 250 MG PO DR TAB
250.0000 mg | DELAYED_RELEASE_TABLET | Freq: Two times a day (BID) | ORAL | Status: DC
  Administered 2016-09-23 – 2016-09-26 (×7): 250 mg via ORAL
  Filled 2016-09-23 (×7): qty 1

## 2016-09-23 MED ORDER — FUROSEMIDE 10 MG/ML IJ SOLN
40.0000 mg | Freq: Two times a day (BID) | INTRAMUSCULAR | Status: DC
  Administered 2016-09-23 – 2016-09-25 (×5): 40 mg via INTRAVENOUS
  Filled 2016-09-23 (×5): qty 4

## 2016-09-23 NOTE — ED Notes (Signed)
Bed: [id number] Expected date:  Expected time:  Means of arrival:  Comments: EMS-sore on heel

## 2016-09-23 NOTE — ED Notes (Signed)
2 unsuccessful attempts for IV access by Loree Feeaylor RN and Zenovia JarredSpencer RN . Another nurse to look of IV access and to draw blood cultures.

## 2016-09-23 NOTE — H&P (Signed)
History and Physical    Michele RocksLillian W Sobol ZOX:[id number] DOB: 04/04/1929 DOA: 09/23/2016  PCP: Thayer HeadingsMACKENZIE,BRIAN, MD  Patient coming from: Home  Chief Complaint: Somnolence  HPI: Michele Cervantes is a 82 y.o. female with medical history significant of Alzheimer dementia, CHF and hypertension. She brought to the hospital because of right foot injury but she was found very somnolent. History was taken from daughter at bedside, patient goes to adult day center during week days, yesterday her daughter noticed that she is sleepy but she was able to get her up and able to eat, this morning she was washing her and some skin sloughed out of her right heel so she brought her to the hospital for further evaluation. Low-grade temp of 99.2 and then developed low temp of 96.8. Patient is somnolent, [facility] hospitalist contacted for observation.  ED Course:  Vitals: Temp of 99.2 and 96.8, rest LDL Labs: WNL, except hemoglobin of 10.9 appears to be chronically low. Imaging: CT head without findings, CXR cannot rule out RUL opacity Interventions: Started on broad-spectrum antibiotics by the ED.  Review of Systems:  Unable to obtain because of dementia and somnolence  Past Medical History:  Diagnosis Date  . Alzheimer disease   . Anginal pain (HCC)   . CHF (congestive heart failure) (HCC)   . Hypercholesteremia   . Hypertension     Past Surgical History:  Procedure Laterality Date  . ABDOMINAL HYSTERECTOMY    . THYROID LOBECTOMY       reports that she has never smoked. She has never used smokeless tobacco. She reports that she does not drink alcohol or use drugs.  No Known Allergies  Family History  Problem Relation Age of Onset  . CAD Mother   . Heart disease Mother   . Breast cancer Mother   . Dementia Mother   . Alcohol abuse Father   . Cancer Brother   . Dementia Brother    Prior to Admission medications   Medication Sig Start Date End Date Taking? Authorizing Provider  aspirin 81  MG tablet Take 81 mg by mouth daily.   Yes Historical Provider, MD  carvedilol (COREG) 6.25 MG tablet Take 6.25 mg by mouth 2 (two) times daily. 12/24/15  Yes Historical Provider, MD  celecoxib (CELEBREX) 200 MG capsule TAKE ONE CAPSULE TWICE A DAY AS NEEDED FOR PAIN 09/21/16  Yes Naiping Donnelly StagerM Xu, MD  divalproex (DEPAKOTE) 250 MG DR tablet TAKE 1 TABLET (250 MG TOTAL) BY MOUTH 2 (TWO) TIMES DAILY. 09/08/16  Yes York Spanielharles K Willis, MD  furosemide (LASIX) 20 MG tablet  07/03/16  Yes Historical Provider, MD  GuaiFENesin (ROBITUSSIN CHEST CONGESTION PO) Take 10 mLs by mouth daily as needed (for cough).   Yes Historical Provider, MD  Memantine HCl-Donepezil HCl (NAMZARIC) 28-10 MG CP24 Take 1 capsule by mouth daily.   Yes Historical Provider, MD  mirtazapine (REMERON) 30 MG tablet TAKE 1 TABLET (30 MG TOTAL) BY MOUTH DAILY. 09/08/16  Yes York Spanielharles K Willis, MD  pravastatin (PRAVACHOL) 10 MG tablet Take 10 mg by mouth at bedtime.  02/21/15  Yes Historical Provider, MD  risperiDONE (RISPERDAL) 1 MG tablet Take 1 mg by mouth 2 (two) times daily.  12/24/15  Yes Historical Provider, MD  traZODone (DESYREL) 100 MG tablet TAKE 1 TABLET (100 MG TOTAL) BY MOUTH AT BEDTIME. 09/06/16  Yes York Spanielharles K Willis, MD    Physical Exam:  Vitals:   09/23/16 0745 09/23/16 0930 09/23/16 1031 09/23/16 1130  BP: 137/59 Marland Kitchen(!)  134/45  141/60  Pulse: 71 66  69  Resp: 12 14  11   Temp: 99.2 F (37.3 C)  (!) 96.8 F (36 C)   TempSrc: Rectal  Rectal   SpO2: 95% 95%  94%    Constitutional: NAD, calm, comfortable Eyes: PERRL, lids and conjunctivae normal ENMT: Mucous membranes are moist. Posterior pharynx clear of any exudate or lesions.Normal dentition.  Neck: normal, supple, no masses, no thyromegaly Respiratory: clear to auscultation bilaterally, no wheezing, no crackles. Normal respiratory effort. No accessory muscle use.  Cardiovascular: Regular rate and rhythm, no murmurs / rubs / gallops. No extremity edema. 2+ pedal pulses. No  carotid bruits.  Abdomen: no tenderness, no masses palpated. No hepatosplenomegaly. Bowel sounds positive.  Musculoskeletal: no clubbing / cyanosis. No joint deformity upper and lower extremities. Good ROM, no contractures. Normal muscle tone.  Skin: no rashes, lesions, ulcers. No induration Neurologic: CN 2-12 grossly intact. Sensation intact, DTR normal. Strength 5/5 in all 4.  Psychiatric: Normal judgment and insight. Alert and oriented x 3. Normal mood.   Labs on Admission: I have personally reviewed following labs and imaging studies  CBC:  Recent Labs Lab 09/23/16 0832 09/23/16 0847  WBC 5.9  --   NEUTROABS 3.1  --   HGB 10.9* 10.9*  HCT 33.3* 32.0*  MCV 91.2  --   PLT 266  --    Basic Metabolic Panel:  Recent Labs Lab 09/23/16 0832 09/23/16 0847  NA 137 140  K 4.2 4.2  CL 104 100*  CO2 29  --   GLUCOSE 86 83  BUN 23* 22*  CREATININE 0.91 1.00  CALCIUM 8.5*  --    GFR: CrCl cannot be calculated (Unknown ideal weight.). Liver Function Tests:  Recent Labs Lab 09/23/16 0832  AST 14*  ALT 9*  ALKPHOS 60  BILITOT 0.5  PROT 6.1*  ALBUMIN 2.9*   No results for input(s): LIPASE, AMYLASE in the last 168 hours. No results for input(s): AMMONIA in the last 168 hours. Coagulation Profile: No results for input(s): INR, PROTIME in the last 168 hours. Cardiac Enzymes: No results for input(s): CKTOTAL, CKMB, CKMBINDEX, TROPONINI in the last 168 hours. BNP (last 3 results) No results for input(s): PROBNP in the last 8760 hours. HbA1C: No results for input(s): HGBA1C in the last 72 hours. CBG:  Recent Labs Lab 09/23/16 0914  GLUCAP 81   Lipid Profile: No results for input(s): CHOL, HDL, LDLCALC, TRIG, CHOLHDL, LDLDIRECT in the last 72 hours. Thyroid Function Tests: No results for input(s): TSH, T4TOTAL, FREET4, T3FREE, THYROIDAB in the last 72 hours. Anemia Panel: No results for input(s): VITAMINB12, FOLATE, FERRITIN, TIBC, IRON, RETICCTPCT in the last 72  hours. Urine analysis:    Component Value Date/Time   COLORURINE YELLOW 09/23/2016 0958   APPEARANCEUR CLEAR 09/23/2016 0958   LABSPEC 1.011 09/23/2016 0958   PHURINE 7.5 09/23/2016 0958   GLUCOSEU NEGATIVE 09/23/2016 0958   HGBUR NEGATIVE 09/23/2016 0958   BILIRUBINUR NEGATIVE 09/23/2016 0958   KETONESUR NEGATIVE 09/23/2016 0958   PROTEINUR NEGATIVE 09/23/2016 0958   UROBILINOGEN 1.0 07/06/2015 2159   NITRITE NEGATIVE 09/23/2016 0958   LEUKOCYTESUR NEGATIVE 09/23/2016 0958   Sepsis Labs: !!!!!!!!!!!!!!!!!!!!!!!!!!!!!!!!!!!!!!!!!!!! Invalid input(s): PROCALCITONIN, LACTICIDVEN No results found for this or any previous visit (from the past 240 hour(s)).   Radiological Exams on Admission: Dg Chest 2 View  Result Date: 09/23/2016 CLINICAL DATA:  Progressive cough. EXAM: CHEST  2 VIEW COMPARISON:  03/15/2016. FINDINGS: Mediastinum and hilar structures normal. Mild infiltrate  right upper lobe cannot be excluded. Low lung volumes. No pleural effusion or pneumothorax . IMPRESSION: 1.  Mild right upper lobe infiltrate cannot be excluded. 2. Low lung volumes with mild basilar atelectasis. Electronically Signed   By: Maisie Fus  Register   On: 09/23/2016 09:37   Ct Head Wo Contrast  Result Date: 09/23/2016 CLINICAL DATA:  Altered mental status EXAM: CT HEAD WITHOUT CONTRAST TECHNIQUE: Contiguous axial images were obtained from the base of the skull through the vertex without intravenous contrast. COMPARISON:  12/26/2015 head CT FINDINGS: Brain: No evidence of parenchymal hemorrhage or extra-axial fluid collection. No mass lesion, mass effect, or midline shift. No CT evidence of acute infarction. Intracranial atherosclerosis. Generalized cerebral volume loss. Nonspecific mild subcortical and periventricular white matter hypodensity, most in keeping with chronic small vessel ischemic change. Cerebral ventricle sizes are stable and concordant with the degree of cerebral volume loss. Stable prominence of  the CSF space in the midline posterior fossa most consistent with mega cisterna magna . Vascular: No hyperdense vessel or unexpected calcification. Skull: No evidence of calvarial fracture. Sinuses/Orbits: The visualized paranasal sinuses are essentially clear. Other:  The mastoid air cells are unopacified. IMPRESSION: 1.  No evidence of acute intracranial abnormality. 2. Generalized cerebral volume loss and mild chronic small vessel ischemia. Electronically Signed   By: Delbert Phenix M.D.   On: 09/23/2016 09:01    EKG: Independently reviewed.   Assessment/Plan Principal Problem:   Altered mental status Active Problems:   Somnolence   Acute on chronic diastolic CHF (congestive heart failure) (HCC)    Acute on chronic diastolic CHF -Last 2-D echo in February 2017 showed LVEF of 65-70% with grade 1 diastolic dysfunction. -Has +2 pedal edema and opacity in the right lung likely nonsymmetrical pulmonary edema. -Started on IV Lasix 40 mg twice a day, given 1 L of normal saline in the ED discontinued the IV fluids. -Elevate legs, restrict fluid and salt intake and likely she will need to increase her Lasix dose at home.  Low-grade fever -Had low-grade fever of 99.2, in the ED they were concerned about sepsis, given broad-spectrum antibiotics. -Hold antibiotics as patient does not have leukocytosis or known source of fever. -Check flu PCR and follow results.  Altered mental status -Presented with somnolence, daughter reported that she sleepier than usual since yesterday. -CT scan of the head was done showed no acute events. -No recent changes in medications, I will hold any sedating medications. -Decrease Desyrel at nighttime to 50 mg. -Follow clinically in the morning.  Dementia -Patient has advanced dementia, high risk of sundowning and acute delirium at night. -On trazodone, dose decreased to 50 mg at night because of somnolence. Can use Haldol as needed if develop acute delirium.  DVT  prophylaxis: SQ Heparin Code Status: Full code Family Communication: Plan D/W patient's daughter at bedside Disposition Plan: Home Consults called:  Admission status: Observation, MedSurg   [facility] A MD [facility] Pager [phone]  If 7PM-7AM, please contact night-coverage [email or web address] Password [email or web address]  09/23/2016, 12:26 PM

## 2016-09-23 NOTE — ED Notes (Signed)
Unsuccessful IV attempt twice. West verbalizes will attempt IV insertion.

## 2016-09-23 NOTE — ED Provider Notes (Signed)
[facility]-EMERGENCY DEPT Provider Note   CSN: [id number] Arrival date & time: 09/23/16  [id number]     History   Chief Complaint Chief Complaint  Patient presents with  . Foot Pain    HPI Michele Cervantes is a 82 y.o. female.  82 yo F with a chief complaint of avulsion to the right heel. This happened while she was getting her bath. She is in a nursing home her attendant noted that she was more sleepy and hard to wake up than normal. This sometimes is normal for her though. Some mild cough for the past couple days denies other symptoms. Denies fevers denies chills. Denies abdominal pain or vomiting. Denies change in diet. Level 5 caveat nonverbal.   The history is provided by the patient and a caregiver. The history is limited by the condition of the patient.  Foot Pain  This is a new problem. The current episode started 1 to 2 hours ago. The problem occurs constantly. The problem has not changed since onset.Pertinent negatives include no chest pain, no headaches and no shortness of breath. Nothing aggravates the symptoms. Nothing relieves the symptoms. She has tried nothing for the symptoms. The treatment provided no relief.    Past Medical History:  Diagnosis Date  . Alzheimer disease   . Anginal pain (HCC)   . CHF (congestive heart failure) (HCC)   . Hypercholesteremia   . Hypertension     Patient Active Problem List   Diagnosis Date Noted  . Altered mental status 09/23/2016  . Somnolence 09/23/2016  . Acute on chronic diastolic CHF (congestive heart failure) (HCC) 09/23/2016  . Goals of care, counseling/discussion   . Left lower lobe pneumonia (HCC) 12/28/2015  . Essential hypertension 12/28/2015  . Pulmonary vascular congestion 12/28/2015  . Demand ischemia (HCC) 12/28/2015  . Aspiration pneumonia of left lung (HCC)   . Palliative care encounter   . Acute encephalopathy 12/26/2015  . Dementia with behavioral disturbance 05/26/2015    Past Surgical History:  Procedure  Laterality Date  . ABDOMINAL HYSTERECTOMY    . THYROID LOBECTOMY      OB History    No data available       Home Medications    Prior to Admission medications   Medication Sig Start Date End Date Taking? Authorizing Provider  aspirin 81 MG tablet Take 81 mg by mouth daily.   Yes Historical Provider, MD  carvedilol (COREG) 6.25 MG tablet Take 6.25 mg by mouth 2 (two) times daily. 12/24/15  Yes Historical Provider, MD  celecoxib (CELEBREX) 200 MG capsule TAKE ONE CAPSULE TWICE A DAY AS NEEDED FOR PAIN 09/21/16  Yes Naiping Donnelly StagerM Xu, MD  divalproex (DEPAKOTE) 250 MG DR tablet TAKE 1 TABLET (250 MG TOTAL) BY MOUTH 2 (TWO) TIMES DAILY. 09/08/16  Yes York Spanielharles K Willis, MD  furosemide (LASIX) 20 MG tablet  07/03/16  Yes Historical Provider, MD  GuaiFENesin (ROBITUSSIN CHEST CONGESTION PO) Take 10 mLs by mouth daily as needed (for cough).   Yes Historical Provider, MD  Memantine HCl-Donepezil HCl (NAMZARIC) 28-10 MG CP24 Take 1 capsule by mouth daily.   Yes Historical Provider, MD  mirtazapine (REMERON) 30 MG tablet TAKE 1 TABLET (30 MG TOTAL) BY MOUTH DAILY. 09/08/16  Yes York Spanielharles K Willis, MD  pravastatin (PRAVACHOL) 10 MG tablet Take 10 mg by mouth at bedtime.  02/21/15  Yes Historical Provider, MD  risperiDONE (RISPERDAL) 1 MG tablet Take 1 mg by mouth 2 (two) times daily.  12/24/15  Yes Historical  Provider, MD  traZODone (DESYREL) 100 MG tablet TAKE 1 TABLET (100 MG TOTAL) BY MOUTH AT BEDTIME. 09/06/16  Yes York Spaniel, MD    Family History Family History  Problem Relation Age of Onset  . CAD Mother   . Heart disease Mother   . Breast cancer Mother   . Dementia Mother   . Alcohol abuse Father   . Cancer Brother   . Dementia Brother     Social History Social History  Substance Use Topics  . Smoking status: Never Smoker  . Smokeless tobacco: Never Used  . Alcohol use No     Allergies   Review of patient's allergies indicates no known allergies.   Review of Systems Review of  Systems  Constitutional: Negative for chills and fever.  HENT: Negative for congestion and rhinorrhea.   Eyes: Negative for redness and visual disturbance.  Respiratory: Positive for cough. Negative for shortness of breath and wheezing.   Cardiovascular: Negative for chest pain and palpitations.  Gastrointestinal: Negative for nausea and vomiting.  Genitourinary: Negative for dysuria and urgency.  Musculoskeletal: Negative for arthralgias and myalgias.  Skin: Positive for wound. Negative for pallor.  Neurological: Negative for dizziness and headaches.     Physical Exam Updated Vital Signs BP (!) 151/49 (BP Location: Left Arm)   Pulse 63   Temp 97.3 F (36.3 C) (Rectal)   Resp 16   SpO2 98%   Physical Exam  Constitutional: She appears well-developed and well-nourished. She appears lethargic. No distress.  HENT:  Head: Normocephalic and atraumatic.  Eyes: EOM are normal. Pupils are equal, round, and reactive to light.  Neck: Normal range of motion. Neck supple.  Cardiovascular: Normal rate and regular rhythm.  Exam reveals no gallop and no friction rub.   No murmur heard. Pulmonary/Chest: Effort normal. She has no wheezes. She has no rales.  Abdominal: Soft. She exhibits no distension. There is no tenderness. There is no guarding.  Musculoskeletal: She exhibits no edema or tenderness.  Neurological: She appears lethargic.  Initial exam patient would respond to pain. Repeat exam patient opens her eyes to voice is able to follow commands.  Skin: Skin is warm and dry. She is not diaphoretic.     Psychiatric: She has a normal mood and affect. Her behavior is normal.  Nursing note and vitals reviewed.    ED Treatments / Results  Labs (all labs ordered are listed, but only abnormal results are displayed) Labs Reviewed  COMPREHENSIVE METABOLIC PANEL - Abnormal; Notable for the following:       Result Value   BUN 23 (*)    Calcium 8.5 (*)    Total Protein 6.1 (*)    Albumin  2.9 (*)    AST 14 (*)    ALT 9 (*)    GFR calc non Af Amer 56 (*)    Anion gap 4 (*)    All other components within normal limits  CBC WITH DIFFERENTIAL/PLATELET - Abnormal; Notable for the following:    RBC 3.65 (*)    Hemoglobin 10.9 (*)    HCT 33.3 (*)    All other components within normal limits  I-STAT CHEM 8, ED - Abnormal; Notable for the following:    Chloride 100 (*)    BUN 22 (*)    Hemoglobin 10.9 (*)    HCT 32.0 (*)    All other components within normal limits  CULTURE, BLOOD (ROUTINE X 2)  URINE CULTURE  URINALYSIS, ROUTINE W REFLEX  MICROSCOPIC (NOT AT [facility])  TSH  INFLUENZA PANEL BY PCR (TYPE A & B, H1N1)  PROTIME-INR  T4  TSH  HEMOGLOBIN A1C  I-STAT CG4 LACTIC ACID, ED  CBG MONITORING, ED  I-STAT CG4 LACTIC ACID, ED    EKG  EKG Interpretation  Date/Time:  Friday September 23 2016 08:57:35 EDT Ventricular Rate:  64 PR Interval:    QRS Duration: 92 QT Interval:  402 QTC Calculation: 415 R Axis:   63 Text Interpretation:  Sinus rhythm Probable left atrial enlargement Probable left ventricular hypertrophy baseline wander inferior leads Otherwise no significant change Confirmed by Adela Lank MD, DANIEL [phone]) on 09/23/2016 9:29:52 AM       Radiology Dg Chest 2 View  Result Date: 09/23/2016 CLINICAL DATA:  Progressive cough. EXAM: CHEST  2 VIEW COMPARISON:  03/15/2016. FINDINGS: Mediastinum and hilar structures normal. Mild infiltrate right upper lobe cannot be excluded. Low lung volumes. No pleural effusion or pneumothorax . IMPRESSION: 1.  Mild right upper lobe infiltrate cannot be excluded. 2. Low lung volumes with mild basilar atelectasis. Electronically Signed   By: Maisie Fus  Register   On: 09/23/2016 09:37   Ct Head Wo Contrast  Result Date: 09/23/2016 CLINICAL DATA:  Altered mental status EXAM: CT HEAD WITHOUT CONTRAST TECHNIQUE: Contiguous axial images were obtained from the base of the skull through the vertex without intravenous contrast. COMPARISON:   12/26/2015 head CT FINDINGS: Brain: No evidence of parenchymal hemorrhage or extra-axial fluid collection. No mass lesion, mass effect, or midline shift. No CT evidence of acute infarction. Intracranial atherosclerosis. Generalized cerebral volume loss. Nonspecific mild subcortical and periventricular white matter hypodensity, most in keeping with chronic small vessel ischemic change. Cerebral ventricle sizes are stable and concordant with the degree of cerebral volume loss. 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 Vascular: No hyperdense vessel or unexpected calcification. Skull: No evidence of calvarial fracture. Sinuses/Orbits: The visualized paranasal sinuses are essentially clear. Other:  The mastoid air cells are unopacified. IMPRESSION: 1.  No evidence of acute intracranial abnormality. 2. Generalized cerebral volume loss and mild chronic small vessel ischemia. Electronically Signed   By: Delbert Phenix M.D.   On: 09/23/2016 09:01    Procedures Procedures (including critical care time)  Medications Ordered in ED Medications  pravastatin (PRAVACHOL) tablet 10 mg (not administered)  aspirin EC tablet 81 mg (81 mg Oral Given 09/23/16 1507)  risperiDONE (RISPERDAL) tablet 1 mg (1 mg Oral Given 09/23/16 1507)  carvedilol (COREG) tablet 6.25 mg (6.25 mg Oral Given 09/23/16 1507)  traZODone (DESYREL) tablet 50 mg (not administered)  divalproex (DEPAKOTE) DR tablet 250 mg (250 mg Oral Given 09/23/16 1507)  heparin injection 5,000 Units (5,000 Units Subcutaneous Given 09/23/16 1506)  sodium chloride flush (NS) 0.9 % injection 3 mL (3 mLs Intravenous Given 09/23/16 1508)  acetaminophen (TYLENOL) tablet 650 mg (not administered)    Or  acetaminophen (TYLENOL) suppository 650 mg (not administered)  HYDROcodone-acetaminophen (NORCO/VICODIN) 5-325 MG per tablet 1-2 tablet (not administered)  ondansetron (ZOFRAN) tablet 4 mg (not administered)      Or  ondansetron (ZOFRAN) injection 4 mg (not administered)  furosemide (LASIX) injection 40 mg (40 mg Intravenous Given 09/23/16 1506)  sodium chloride 0.9 % bolus 1,000 mL (0 mLs Intravenous Stopped 09/23/16 1047)  piperacillin-tazobactam (ZOSYN) IVPB 3.375 g (0 g Intravenous Stopped 09/23/16 1221)     Initial Impression / Assessment and Plan / ED Course  I have reviewed the triage  vital signs and the nursing notes.  Pertinent labs & imaging results that were available during my care of the patient were reviewed by me and considered in my medical decision making (see chart for details).  Clinical Course    82 yo F With a chief complaint of avulsion to the right heel. Patient was also less responsive than baseline on arrival. She is on Risperdal as well as trazodone. They deny any change in medications. Patient felt mildly warm to me that she is 99 2 rectally. Chest x-ray was negative. Will obtain a urine to rule out UTI. She has no intraoral or vaginal lesions. I doubt this is Kathlen BrunswickStevens Johnsons at this time.  Patient became hypothermic, no uti or pna.  Still remains altered.  Admit to obs.   The patients results and plan were reviewed and discussed.   Any x-rays performed were independently reviewed by myself.   Differential diagnosis were considered with the presenting HPI.  Medications  pravastatin (PRAVACHOL) tablet 10 mg (not administered)  aspirin EC tablet 81 mg (81 mg Oral Given 09/23/16 1507)  risperiDONE (RISPERDAL) tablet 1 mg (1 mg Oral Given 09/23/16 1507)  carvedilol (COREG) tablet 6.25 mg (6.25 mg Oral Given 09/23/16 1507)  traZODone (DESYREL) tablet 50 mg (not administered)  divalproex (DEPAKOTE) DR tablet 250 mg (250 mg Oral Given 09/23/16 1507)  heparin injection 5,000 Units (5,000 Units Subcutaneous Given 09/23/16 1506)  sodium chloride flush (NS) 0.9 % injection 3 mL (3 mLs Intravenous Given 09/23/16 1508)  acetaminophen (TYLENOL) tablet 650 mg (not administered)     Or  acetaminophen (TYLENOL) suppository 650 mg (not administered)  HYDROcodone-acetaminophen (NORCO/VICODIN) 5-325 MG per tablet 1-2 tablet (not administered)  ondansetron (ZOFRAN) tablet 4 mg (not administered)    Or  ondansetron (ZOFRAN) injection 4 mg (not administered)  furosemide (LASIX) injection 40 mg (40 mg Intravenous Given 09/23/16 1506)  sodium chloride 0.9 % bolus 1,000 mL (0 mLs Intravenous Stopped 09/23/16 1047)  piperacillin-tazobactam (ZOSYN) IVPB 3.375 g (0 g Intravenous Stopped 09/23/16 1221)    Vitals:   09/23/16 1130 09/23/16 1240 09/23/16 1302 09/23/16 1340  BP: 141/60 127/56 (!) 136/53 (!) 151/49  Pulse: 69 63 64 63  Resp: 11 12 15 16   Temp:   97.3 F (36.3 C)   TempSrc:   Rectal   SpO2: 94% 93% 93% 98%    Final diagnoses:  Lethargy    Admission/ observation were discussed with the admitting physician, patient and/or family and they are comfortable with the plan.    Final Clinical Impressions(s) / ED Diagnoses   Final diagnoses:  Lethargy    New Prescriptions Current Discharge Medication List       Melene PlanDan Gloristine Turrubiates, DO 09/23/16 1533

## 2016-09-23 NOTE — ED Triage Notes (Addendum)
[facility] was given a bath this morning and staff found an open sore on her right heel. Per EMS the sore is absent from bleeding or drainage but there was a "chunk of skin" hanging off her heel. Pt has c/o cough for the past few days and has been taking OTC medications. Per EMS pt warm to touch but was unable to obtain a temperature.

## 2016-09-23 NOTE — ED Notes (Signed)
Pt rectal temperature verified with two different thermometers. Warm blanket applied. Michele Cervantes verbalizes EMS verbalizes pt house really warm; "the heat was on hight."

## 2016-09-23 NOTE — ED Notes (Signed)
Pt in CT at present time 

## 2016-09-23 NOTE — ED Notes (Signed)
Rectal temp 99.2 during triage.

## 2016-09-24 DIAGNOSIS — I5033 Acute on chronic diastolic (congestive) heart failure: Secondary | ICD-10-CM | POA: Diagnosis present

## 2016-09-24 DIAGNOSIS — E7800 Pure hypercholesterolemia, unspecified: Secondary | ICD-10-CM | POA: Diagnosis present

## 2016-09-24 DIAGNOSIS — F0390 Unspecified dementia without behavioral disturbance: Secondary | ICD-10-CM | POA: Diagnosis not present

## 2016-09-24 DIAGNOSIS — Z7982 Long term (current) use of aspirin: Secondary | ICD-10-CM | POA: Diagnosis not present

## 2016-09-24 DIAGNOSIS — F0281 Dementia in other diseases classified elsewhere with behavioral disturbance: Secondary | ICD-10-CM | POA: Diagnosis not present

## 2016-09-24 DIAGNOSIS — R627 Adult failure to thrive: Secondary | ICD-10-CM

## 2016-09-24 DIAGNOSIS — R1310 Dysphagia, unspecified: Secondary | ICD-10-CM | POA: Diagnosis not present

## 2016-09-24 DIAGNOSIS — G9341 Metabolic encephalopathy: Secondary | ICD-10-CM | POA: Diagnosis present

## 2016-09-24 DIAGNOSIS — Z7409 Other reduced mobility: Secondary | ICD-10-CM | POA: Diagnosis not present

## 2016-09-24 DIAGNOSIS — L89612 Pressure ulcer of right heel, stage 2: Secondary | ICD-10-CM | POA: Diagnosis present

## 2016-09-24 DIAGNOSIS — F0280 Dementia in other diseases classified elsewhere without behavioral disturbance: Secondary | ICD-10-CM | POA: Diagnosis not present

## 2016-09-24 DIAGNOSIS — I110 Hypertensive heart disease with heart failure: Secondary | ICD-10-CM | POA: Diagnosis present

## 2016-09-24 DIAGNOSIS — R400 Somnolence: Secondary | ICD-10-CM

## 2016-09-24 DIAGNOSIS — R4182 Altered mental status, unspecified: Secondary | ICD-10-CM | POA: Diagnosis present

## 2016-09-24 DIAGNOSIS — G309 Alzheimer's disease, unspecified: Secondary | ICD-10-CM | POA: Diagnosis present

## 2016-09-24 DIAGNOSIS — J9811 Atelectasis: Secondary | ICD-10-CM | POA: Diagnosis present

## 2016-09-24 DIAGNOSIS — Z79899 Other long term (current) drug therapy: Secondary | ICD-10-CM | POA: Diagnosis not present

## 2016-09-24 LAB — BASIC METABOLIC PANEL
ANION GAP: 8 (ref 5–15)
BUN: 21 mg/dL — ABNORMAL HIGH (ref 6–20)
CO2: 29 mmol/L (ref 22–32)
Calcium: 8.6 mg/dL — ABNORMAL LOW (ref 8.9–10.3)
Chloride: 103 mmol/L (ref 101–111)
Creatinine, Ser: 0.75 mg/dL (ref 0.44–1.00)
GFR calc Af Amer: >60 mL/min (ref >60)
GLUCOSE: 72 mg/dL (ref 65–99)
POTASSIUM: 4.1 mmol/L (ref 3.5–5.1)
Sodium: 140 mmol/L (ref 135–145)

## 2016-09-24 LAB — CBC
HEMATOCRIT: 32.7 % — AB (ref 36.0–46.0)
HEMOGLOBIN: 10.7 g/dL — AB (ref 12.0–15.0)
MCH: 30 pg (ref 26.0–34.0)
MCHC: 32.7 g/dL (ref 30.0–36.0)
MCV: 91.6 fL (ref 78.0–100.0)
Platelets: 271 10*3/uL (ref 150–400)
RBC: 3.57 MIL/uL — AB (ref 3.87–5.11)
RDW: 14 % (ref 11.5–15.5)
WBC: 5.8 10*3/uL (ref 4.0–10.5)

## 2016-09-24 LAB — HEMOGLOBIN A1C
Hgb A1c MFr Bld: 5.3 % (ref 4.8–5.6)
Mean Plasma Glucose: 105 mg/dL

## 2016-09-24 LAB — MRSA PCR SCREENING: MRSA by PCR: NEGATIVE

## 2016-09-24 LAB — URINE CULTURE: CULTURE: NO GROWTH

## 2016-09-24 LAB — T4: T4, Total: 7.5 ug/dL (ref 4.5–12.0)

## 2016-09-24 MED ORDER — MEMANTINE HCL ER 28 MG PO CP24
28.0000 mg | ORAL_CAPSULE | Freq: Every day | ORAL | Status: DC
  Administered 2016-09-24 – 2016-09-26 (×3): 28 mg via ORAL
  Filled 2016-09-24 (×3): qty 1

## 2016-09-24 MED ORDER — DONEPEZIL HCL 10 MG PO TABS
10.0000 mg | ORAL_TABLET | Freq: Every day | ORAL | Status: DC
  Administered 2016-09-24 – 2016-09-26 (×3): 10 mg via ORAL
  Filled 2016-09-24 (×3): qty 1

## 2016-09-24 MED ORDER — SENNOSIDES-DOCUSATE SODIUM 8.6-50 MG PO TABS
1.0000 | ORAL_TABLET | Freq: Two times a day (BID) | ORAL | Status: DC
  Administered 2016-09-24 – 2016-09-26 (×5): 1 via ORAL
  Filled 2016-09-24 (×5): qty 1

## 2016-09-24 MED ORDER — MEMANTINE HCL-DONEPEZIL HCL ER 28-10 MG PO CP24: 1.0000 | ORAL_CAPSULE | Freq: Every day | ORAL | Status: DC

## 2016-09-24 MED ORDER — AMOXICILLIN-POT CLAVULANATE 875-125 MG PO TABS
1.0000 | ORAL_TABLET | Freq: Two times a day (BID) | ORAL | Status: DC
  Administered 2016-09-24 – 2016-09-26 (×5): 1 via ORAL
  Filled 2016-09-24 (×5): qty 1

## 2016-09-24 NOTE — Progress Notes (Signed)
PROGRESS NOTE  Michele Cervantes GNF:[id number] DOB: 06/09/1929 DOA: 09/23/2016 PCP: Michele HeadingsMACKENZIE,BRIAN, MD  HPI/Recap of past 24 hours:  Awake, denies pain, oriented to person, know she is in the hospital, not able to name the hospital Not oriented to time She denies pain, she cannot provide any history due to advanced dementia  Patient is seen with daughter and RN in room Daughter report patient's mental status is at baseline, her lower extremity edema has improved,  Daughter report patient does cough some time when have oral intake, patient at baseline not able to get out of bed herself,   Assessment/Plan: Principal Problem:   Altered mental status Active Problems:   Somnolence   Acute on chronic diastolic CHF (congestive heart failure) (HCC)    Somnolence likely combination of acute metabolic encephalopathy from aspiration pna /wound infection and progression of dementia Daughter report patient was somnolent two days ago, currently she is at her baseline aspiration precaution, swallow eval  Low-grade temp of 99.2 and then developed low temp of 96.8, concerning for sepsis initially, ua unremarkable, blood culture no growth, cxr "Mild right upper lobe infiltrate cannot be excluded. Low lung volumes with mild basilar atelectasis." she dose has wound Lactic acid wnl, she received vanc/zosyn in the ED, start augmentin   Acute on chronic Diastolic chf exacerbation: with bilateral lower extremity edema and right heal blister that ruptured:  Coreg, lasix  Dementia,  advanced, family report patient has 24/7 care at home, case manager consulted Continue namenda, risperdal, trasozone I have discussed with daughter that patient is at risk of sundowning, I have encouraged family to stay with her.   Code Status: Full  Family Communication: patient   Disposition Plan: home with home health in a few days   Consultants:  none  Procedures:  none  Antibiotics:  Vanc/ zosyn in  the EDx1  augmentin from 11/4   Objective: BP 114/64 (BP Location: Left Arm)   Pulse 60   Temp 98.7 F (37.1 C) (Oral)   Resp 18   SpO2 98%   Intake/Output Summary (Last 24 hours) at 09/24/16 1206 Last data filed at 09/23/16 1600  Gross per 24 hour  Intake              540 ml  Output                0 ml  Net              540 ml   There were no vitals filed for this visit.  Exam:   General:  NAD, demented elderly  Cardiovascular: RRR  Respiratory: CTABL  Abdomen: Soft/ND/NT, positive BS  Musculoskeletal: bilateral lower  Edema, right heal blister has ruptured, does not look infected  Neuro: oriented to person, know she is in the hospital, very slow to response, per daughter this is her baseline  Data Reviewed: Basic Metabolic Panel:  Recent Labs Lab 09/23/16 0832 09/23/16 0847 09/24/16 0532  NA 137 140 140  K 4.2 4.2 4.1  CL 104 100* 103  CO2 29  --  29  GLUCOSE 86 83 72  BUN 23* 22* 21*  CREATININE 0.91 1.00 0.75  CALCIUM 8.5*  --  8.6*   Liver Function Tests:  Recent Labs Lab 09/23/16 0832  AST 14*  ALT 9*  ALKPHOS 60  BILITOT 0.5  PROT 6.1*  ALBUMIN 2.9*   No results for input(s): LIPASE, AMYLASE in the last 168 hours. No results for input(s): AMMONIA in  the last 168 hours. CBC:  Recent Labs Lab 09/23/16 0832 09/23/16 0847 09/24/16 0532  WBC 5.9  --  5.8  NEUTROABS 3.1  --   --   HGB 10.9* 10.9* 10.7*  HCT 33.3* 32.0* 32.7*  MCV 91.2  --  91.6  PLT 266  --  271   Cardiac Enzymes:   No results for input(s): CKTOTAL, CKMB, CKMBINDEX, TROPONINI in the last 168 hours. BNP (last 3 results)  Recent Labs  12/26/15 2112 03/15/16 1556  BNP 42.5 87.6    ProBNP (last 3 results) No results for input(s): PROBNP in the last 8760 hours.  CBG:  Recent Labs Lab 09/23/16 0914  GLUCAP 81    Recent Results (from the past 240 hour(s))  Blood Culture (routine x 2)     Status: None (Preliminary result)   Collection Time: 09/23/16   8:32 AM  Result Value Ref Range Status   Specimen Description BLOOD RIGHT HAND  Final   Special Requests BOTTLES DRAWN AEROBIC AND ANAEROBIC 5ML  Final   Culture   Final    NO GROWTH 1 DAY [facility]    Report Status PENDING  Incomplete  Urine culture     Status: None   Collection Time: 09/23/16  9:58 AM  Result Value Ref Range Status   Specimen Description URINE, RANDOM  Final   Special Requests NONE  Final   Culture NO GROWTH [facility]   Final   Report Status 09/24/2016 FINAL  Final     Studies: No results found.  Scheduled Meds: . aspirin EC  81 mg Oral Daily  . carvedilol  6.25 mg Oral BID  . divalproex  250 mg Oral BID  . furosemide  40 mg Intravenous BID  . heparin  5,000 Units Subcutaneous Q8H  . pravastatin  10 mg Oral QHS  . risperiDONE  1 mg Oral BID  . sodium chloride flush  3 mL Intravenous Q12H  . traZODone  50 mg Oral QHS    Continuous Infusions:    Time spent: 35mins  [name] MD, PhD  [facility] Pager [phone]. If 7PM-7AM, please contact night-coverage at [email or web address], password [email or web address] 09/24/2016, 12:06 PM  LOS: 0 days

## 2016-09-24 NOTE — Care Management Obs Status (Addendum)
MEDICARE OBSERVATION STATUS NOTIFICATION   Patient Details  Name: Michele Cervantes MRN: [id number] Date of Birth: 04/09/1929   Medicare Observation Status Notification Given:  Yes Provided information to pts dtr, Michele Cervantes.    Elliot CousinShavis, Francyne Arreaga Ellen, RN 09/24/2016, 11:33 AM

## 2016-09-24 NOTE — Consult Note (Signed)
WOC Nurse wound consult note Reason for Consult: Bilateral great toes with areas of black discoloration (vascular infarct), right heel with ruptured blister (Stage 2), loose hanging non-viable epidermis removed without pain or bleeding. Wound type: Pressure, vascular infarct Pressure Ulcer POA: Yes Measurement: Right lateral heel with 4.5cm x 3cm x 0.1cm with red, moist wound bed, serous exudate. RGT with 1.5cm round black discoloration, LGT with 1cm round black discoloration. Neither toe has drainage or is open. Wound bed:As described above Drainage (amount, consistency, odor) As described above Periwound: Intact, dry.  Bilateral LEs with edema, R>L. Dressing procedure/placement/frequency: I will today provide bilateral pressure redistribution heel boots and give Nursing guidance via the Orders for topical care of the Stage 2 pressure injury at the right heel and for the bilateral great toes. WOC nursing team will not follow, but will remain available to this patient, the nursing and medical teams.  Please re-consult if needed. Thanks, Ladona MowLaurie Noe Pittsley, MSN, RN, GNP, Hans EdenCWOCN, CWON-AP, FAAN  Pager# [phone]

## 2016-09-24 NOTE — Care Management Note (Signed)
Case Management Note  Patient Details  Name: Michele Cervantes MRN: [id number] Date of Birth: 09/23/1929  Subjective/Objective:   CHF, low grade fever, AMS                 Action/Plan: Discharge Planning: NCM spoke to dtr, Carlyle BasquesSandra Williams [phone] at bedside. Pt lives in home with dtr. [facility] during the day. Dtr states she has a RW and bedside commode in the home. She has a wheelchair but it is an older one that she got from her brother. Dtr is requesting a wheelchair and hospital bed.    Expected Discharge Date:                Expected Discharge Plan:  Home/Self Care  In-House Referral:  NA  Discharge planning Services  CM Consult  Post Acute Care Choice:  NA Choice offered to:  NA  DME Arranged:  Lightweight manual wheelchair with seat cushion, Hospital bed DME Agency:  [facility]  [facility]:     Status of Service:  In process, will continue to follow  If discussed at Long Length of Stay Meetings, dates discussed:    Additional Comments:  Elliot CousinShavis, Liylah Najarro Ellen, RN 09/24/2016, 11:54 AM

## 2016-09-25 DIAGNOSIS — F0390 Unspecified dementia without behavioral disturbance: Secondary | ICD-10-CM

## 2016-09-25 LAB — BASIC METABOLIC PANEL
ANION GAP: 8 (ref 5–15)
BUN: 31 mg/dL — ABNORMAL HIGH (ref 6–20)
CALCIUM: 8.6 mg/dL — AB (ref 8.9–10.3)
CO2: 31 mmol/L (ref 22–32)
Chloride: 101 mmol/L (ref 101–111)
Creatinine, Ser: 0.91 mg/dL (ref 0.44–1.00)
GFR, EST NON AFRICAN AMERICAN: 56 mL/min — AB (ref >60)
GLUCOSE: 86 mg/dL (ref 65–99)
POTASSIUM: 4 mmol/L (ref 3.5–5.1)
Sodium: 140 mmol/L (ref 135–145)

## 2016-09-25 LAB — CBC
HEMATOCRIT: 34.2 % — AB (ref 36.0–46.0)
HEMOGLOBIN: 11.2 g/dL — AB (ref 12.0–15.0)
MCH: 29.7 pg (ref 26.0–34.0)
MCHC: 32.7 g/dL (ref 30.0–36.0)
MCV: 90.7 fL (ref 78.0–100.0)
Platelets: 308 10*3/uL (ref 150–400)
RBC: 3.77 MIL/uL — AB (ref 3.87–5.11)
RDW: 14.2 % (ref 11.5–15.5)
WBC: 7.1 10*3/uL (ref 4.0–10.5)

## 2016-09-25 NOTE — Evaluation (Signed)
Physical Therapy Evaluation Patient Details Name: Michele Cervantes MRN: [id number] DOB: 07/07/1929 Today's Date: 09/25/2016   History of Present Illness  Michele Cervantes is a 82 y.o. female with medical history significant of Alzheimer dementia, CHF and hypertension. She brought to the hospital because of right foot injury but she was found very somnolent  Clinical Impression  Pt admitted with above diagnosis. Pt currently with functional limitations due to the deficits listed below (see PT Problem List).  Pt will benefit from skilled PT to increase their independence and safety with mobility to allow discharge to the venue listed below.  Pt with slow processing but follows commands well with incr time; will follow; will benefit from HHPT      Follow Up Recommendations Home health PT;Supervision/Assistance - 24 hour    Equipment Recommendations  Other (comment) (may need hoyer lift or possibly Stedy)    Recommendations for Other Services       Precautions / Restrictions Precautions Precautions: Fall Restrictions Weight Bearing Restrictions: No      Mobility  Bed Mobility Overal bed mobility: Needs Assistance;+2 for physical assistance Bed Mobility: Supine to Sit;Sit to Supine     Supine to sit: Mod assist;+2 for physical assistance;Min assist Sit to supine: +2 for physical assistance;Mod assist;Max assist   General bed mobility comments: assist for trunk and LEs in both directions; requires incr time   Transfers Overall transfer level: Needs assistance Equipment used: Rolling walker (2 wheeled) Transfers: Sit to/from Stand Sit to Stand: Mod assist;Max assist;+2 physical assistance         General transfer comment: assist to rise and stabilize, unable to come to full stand   Ambulation/Gait                Stairs            Wheelchair Mobility    Modified Rankin (Stroke Patients Only)       Balance Overall balance assessment: Needs  assistance Sitting-balance support: Feet supported Sitting balance-Leahy Scale: Fair Sitting balance - Comments: pt able to reach with RUE minimally outside BOS, difficulty with anterior translation Postural control: Posterior lean;Right lateral lean Standing balance support: Bilateral upper extremity supported Standing balance-Leahy Scale: Zero                               Pertinent Vitals/Pain Pain Assessment: No/denies pain    Home Living Family/patient expects to be discharged to:: Private residence Living Arrangements: Children (lives with son and dtr) Available Help at Discharge: Family;Available 24 hours/day Type of Home: House         Home Equipment: Walker - 2 wheels Additional Comments: goes to adult daycare; dtr reports she assists with ADLs    Prior Function Level of Independence: Needs assistance   Gait / Transfers Assistance Needed: assistance and RW; son reports some days she is requires less assist than others           Hand Dominance        Extremity/Trunk Assessment   Upper Extremity Assessment: Defer to OT evaluation           Lower Extremity Assessment: Generalized weakness         Communication   Communication: No difficulties  Cognition Arousal/Alertness: Awake/alert Behavior During Therapy: Flat affect Overall Cognitive Status: History of cognitive impairments - at baseline Area of Impairment: Problem solving;Following commands       Following Commands: Follows one  step commands consistently;Follows one step commands with increased time     Problem Solving: Slow processing;Decreased initiation;Requires verbal cues;Requires tactile cues      General Comments      Exercises     Assessment/Plan    PT Assessment Patient needs continued PT services  PT Problem List Decreased strength;Decreased activity tolerance;Decreased balance;Decreased mobility;Decreased cognition          PT Treatment Interventions  DME instruction;Gait training;Functional mobility training;Therapeutic exercise;Therapeutic activities    PT Goals (Current goals can be found in the Care Plan section)  Acute Rehab PT Goals Patient Stated Goal: home PT Goal Formulation: With family Time For Goal Achievement: 10/02/16 Potential to Achieve Goals: Fair    Frequency Min 3X/week   Barriers to discharge        Co-evaluation               End of Session Equipment Utilized During Treatment: Gait belt Activity Tolerance: Patient tolerated treatment well;Patient limited by fatigue Patient left: in bed;with call bell/phone within reach;with family/visitor present;with bed alarm set Nurse Communication: Mobility status         Time: [id number] PT Time Calculation (min) (ACUTE ONLY): 18 min   Charges:   PT Evaluation $PT Eval Moderate Complexity: 1 Procedure     PT G Codes:        Michele Cervantes 09/25/2016, 5:09 PM

## 2016-09-25 NOTE — Progress Notes (Signed)
PROGRESS NOTE  Michele Cervantes AVW:[id number] DOB: 09/15/1929 DOA: 09/23/2016 PCP: Thayer HeadingsMACKENZIE,BRIAN, MD  HPI/Recap of past 24 hours:  Awake, denies pain, oriented to person, know she is in the hospital, not able to name the hospital Not oriented to time She denies pain, she cannot provide any history due to advanced dementia, answer simple questions, but does so very slowly.  Patient is seen with daughter  in room Daughter report patient's mental status is at baseline, her lower extremity edema has improved,  Daughter report patient does cough some time when have oral intake, she observed no cough, since diet changed to puree form  patient at baseline not able to get out of bed herself,   Assessment/Plan: Principal Problem:   Altered mental status Active Problems:   Somnolence   Acute on chronic diastolic CHF (congestive heart failure) (HCC)   Altered mental state    Somnolence likely combination of acute metabolic encephalopathy from aspiration pna /wound infection and progression of dementia Daughter report patient was somnolent two days ago, currently she is at her baseline aspiration precaution, swallow eval  Low-grade temp of 99.2 and then developed low temp of 96.8, concerning for sepsis initially, ua unremarkable, blood culture no growth, cxr "Mild right upper lobe infiltrate cannot be excluded. Low lung volumes with mild basilar atelectasis." she dose has wound Lactic acid wnl, she received vanc/zosyn in the ED, start augmentin   Acute on chronic Diastolic chf exacerbation: with bilateral lower extremity edema and right heal blister that ruptured:  Coreg, lasix, Edema improving, legs start to wrinkle, likely will d/c on higher dose of lasix  Dementia,  advanced, family report patient has 24/7 care at home, case manager consulted Continue namenda, risperdal, trasozone I have discussed with daughter that patient is at risk of sundowning, I have encouraged family to  stay with her.   Code Status: Full  Family Communication: patient   Disposition Plan: home with home health , likely on 11/6 after swallow eval   Consultants:  none  Procedures:  none  Antibiotics:  Vanc/ zosyn in the EDx1  augmentin from 11/4   Objective: BP (!) 118/50 (BP Location: Right Arm)   Pulse 61   Temp 97.9 F (36.6 C) (Axillary)   Resp 18   SpO2 97%   Intake/Output Summary (Last 24 hours) at 09/25/16 1425 Last data filed at 09/24/16 1446  Gross per 24 hour  Intake              210 ml  Output                0 ml  Net              210 ml   There were no vitals filed for this visit.  Exam:   General:  NAD, demented elderly, very slow in response  Cardiovascular: RRR  Respiratory: CTABL  Abdomen: Soft/ND/NT, positive BS  Musculoskeletal: bilateral lower  Edema improving,  right heal blister has ruptured, does not look infected  Neuro: oriented to person, know she is in the hospital, very slow to response, per daughter this is her baseline  Data Reviewed: Basic Metabolic Panel:  Recent Labs Lab 09/23/16 0832 09/23/16 0847 09/24/16 0532 09/25/16 0551  NA 137 140 140 140  K 4.2 4.2 4.1 4.0  CL 104 100* 103 101  CO2 29  --  29 31  GLUCOSE 86 83 72 86  BUN 23* 22* 21* 31*  CREATININE 0.91 1.00 0.75  0.91  CALCIUM 8.5*  --  8.6* 8.6*   Liver Function Tests:  Recent Labs Lab 09/23/16 0832  AST 14*  ALT 9*  ALKPHOS 60  BILITOT 0.5  PROT 6.1*  ALBUMIN 2.9*   No results for input(s): LIPASE, AMYLASE in the last 168 hours. No results for input(s): AMMONIA in the last 168 hours. CBC:  Recent Labs Lab 09/23/16 0832 09/23/16 0847 09/24/16 0532 09/25/16 0551  WBC 5.9  --  5.8 7.1  NEUTROABS 3.1  --   --   --   HGB 10.9* 10.9* 10.7* 11.2*  HCT 33.3* 32.0* 32.7* 34.2*  MCV 91.2  --  91.6 90.7  PLT 266  --  271 308   Cardiac Enzymes:   No results for input(s): CKTOTAL, CKMB, CKMBINDEX, TROPONINI in the last 168 hours. BNP  (last 3 results)  Recent Labs  12/26/15 2112 03/15/16 1556  BNP 42.5 87.6    ProBNP (last 3 results) No results for input(s): PROBNP in the last 8760 hours.  CBG:  Recent Labs Lab 09/23/16 0914  GLUCAP 81    Recent Results (from the past 240 hour(s))  Blood Culture (routine x 2)     Status: None (Preliminary result)   Collection Time: 09/23/16  8:32 AM  Result Value Ref Range Status   Specimen Description BLOOD RIGHT HAND  Final   Special Requests BOTTLES DRAWN AEROBIC AND ANAEROBIC 5ML  Final   Culture   Final    NO GROWTH 2 DAYS [facility]    Report Status PENDING  Incomplete  Urine culture     Status: None   Collection Time: 09/23/16  9:58 AM  Result Value Ref Range Status   Specimen Description URINE, RANDOM  Final   Special Requests NONE  Final   Culture NO GROWTH [facility]formed [facility]   Final   Report Status 09/24/2016 FINAL  Final  MRSA PCR Screening     Status: None   Collection Time: 09/24/16  1:30 PM  Result Value Ref Range Status   MRSA by PCR NEGATIVE NEGATIVE Final    Comment:        The GeneXpert MRSA Assay (FDA approved for NASAL specimens only), is one component of a comprehensive MRSA colonization surveillance program. It is not intended to diagnose MRSA infection nor to guide or monitor treatment for MRSA infections.      Studies: No results found.  Scheduled Meds: . amoxicillin-clavulanate  1 tablet Oral Q12H  . aspirin EC  81 mg Oral Daily  . carvedilol  6.25 mg Oral BID  . divalproex  250 mg Oral BID  . memantine  28 mg Oral Daily   And  . donepezil  10 mg Oral Daily  . furosemide  40 mg Intravenous BID  . heparin  5,000 Units Subcutaneous Q8H  . pravastatin  10 mg Oral QHS  . risperiDONE  1 mg Oral BID  . senna-docusate  1 tablet Oral BID  . sodium chloride flush  3 mL Intravenous Q12H  . traZODone  50 mg Oral QHS    Continuous Infusions:   Time spent: 25mins  Alyssamae Klinck MD,  PhD  [facility] Pager [phone]. If 7PM-7AM, please contact night-coverage at [email or web address], password [email or web address] 09/25/2016, 2:25 PM  LOS: 1 day

## 2016-09-26 DIAGNOSIS — R627 Adult failure to thrive: Secondary | ICD-10-CM

## 2016-09-26 DIAGNOSIS — Z7409 Other reduced mobility: Secondary | ICD-10-CM

## 2016-09-26 DIAGNOSIS — F0281 Dementia in other diseases classified elsewhere with behavioral disturbance: Secondary | ICD-10-CM

## 2016-09-26 DIAGNOSIS — F02818 Dementia in other diseases classified elsewhere, unspecified severity, with other behavioral disturbance: Secondary | ICD-10-CM

## 2016-09-26 DIAGNOSIS — L89002 Pressure ulcer of unspecified elbow, stage 2: Secondary | ICD-10-CM

## 2016-09-26 LAB — BASIC METABOLIC PANEL
Anion gap: 8 (ref 5–15)
BUN: 36 mg/dL — AB (ref 6–20)
CO2: 31 mmol/L (ref 22–32)
CREATININE: 1.07 mg/dL — AB (ref 0.44–1.00)
Calcium: 8.6 mg/dL — ABNORMAL LOW (ref 8.9–10.3)
Chloride: 102 mmol/L (ref 101–111)
GFR calc Af Amer: 53 mL/min — ABNORMAL LOW (ref >60)
GFR, EST NON AFRICAN AMERICAN: 46 mL/min — AB (ref >60)
GLUCOSE: 88 mg/dL (ref 65–99)
POTASSIUM: 4.3 mmol/L (ref 3.5–5.1)
SODIUM: 141 mmol/L (ref 135–145)

## 2016-09-26 LAB — CBC
HEMATOCRIT: 32.9 % — AB (ref 36.0–46.0)
Hemoglobin: 10.7 g/dL — ABNORMAL LOW (ref 12.0–15.0)
MCH: 29.9 pg (ref 26.0–34.0)
MCHC: 32.5 g/dL (ref 30.0–36.0)
MCV: 91.9 fL (ref 78.0–100.0)
PLATELETS: 299 10*3/uL (ref 150–400)
RBC: 3.58 MIL/uL — ABNORMAL LOW (ref 3.87–5.11)
RDW: 14.5 % (ref 11.5–15.5)
WBC: 6.9 10*3/uL (ref 4.0–10.5)

## 2016-09-26 LAB — MAGNESIUM: Magnesium: 2.2 mg/dL (ref 1.7–2.4)

## 2016-09-26 MED ORDER — SENNOSIDES-DOCUSATE SODIUM 8.6-50 MG PO TABS: 1.0000 | ORAL_TABLET | Freq: Every day | ORAL | 0 refills | Status: AC

## 2016-09-26 MED ORDER — FUROSEMIDE 20 MG PO TABS: ORAL_TABLET | ORAL | 0 refills | Status: AC

## 2016-09-26 MED ORDER — AMOXICILLIN-POT CLAVULANATE 875-125 MG PO TABS: 1.0000 | ORAL_TABLET | Freq: Two times a day (BID) | ORAL | 0 refills | Status: AC

## 2016-09-26 NOTE — Evaluation (Signed)
Clinical/Bedside Swallow Evaluation Patient Details  Name: Michele Cervantes MRN: [id number] Date of Birth: 07/30/1929  Today's Date: 09/26/2016 Time: SLP Start Time (ACUTE ONLY): 1245 SLP Stop Time (ACUTE ONLY): 1306 SLP Time Calculation (min) (ACUTE ONLY): 21 min  Past Medical History:  Past Medical History:  Diagnosis Date  . Alzheimer disease   . Anginal pain (HCC)   . CHF (congestive heart failure) (HCC)   . Hypercholesteremia   . Hypertension    Past Surgical History:  Past Surgical History:  Procedure Laterality Date  . ABDOMINAL HYSTERECTOMY    . THYROID LOBECTOMY     HPI:  82 yo female [facility], per imaging study pt with ? RUL infiltrate.  PMH + for CHF, Alz Disease, HTN, right foot injury.  Swallow eval ordered.    Assessment / Plan / Recommendation Clinical Impression  Pt presents with mild oral deficits likely related to her dementia.  Delayed oral manipulation/mastication noted but no significant pocketing.  Pt able to self feed fortuantely.  She did have subtle throat clearing with sequential boluses of thin via straw.  This was eliminated with small single boluses.  Recommend advance to dys3/thin diet with precautions.  Educated pt to findings and provided written tips due to pt's dementia and no family present.     Aspiration Risk  Mild aspiration risk    Diet Recommendation Dysphagia 3 (Mech soft);Thin liquid   Liquid Administration via: Straw;Cup Medication Administration: Whole meds with puree Compensations: Slow rate;Small sips/bites Postural Changes: Seated upright at 90 degrees;Remain upright for at least 30 minutes after po intake    Other  Recommendations Oral Care Recommendations: Oral care BID   Follow up Recommendations        Frequency and Duration            Prognosis        Swallow Study   General Date of Onset: 09/26/16 HPI: 82 [facility] female [facility], per imaging study pt with ? RUL infiltrate.  PMH + for CHF, Alz  Disease, HTN, right foot injury.  Swallow eval ordered.  Type of Study: Bedside Swallow Evaluation Diet Prior to this Study: Dysphagia 1 (puree);Honey-thick liquids Temperature Spikes Noted: No Respiratory Status: Room air History of Recent Intubation: No Behavior/Cognition: Alert;Cooperative;Pleasant mood Oral Cavity Assessment: Within Functional Limits Oral Care Completed by SLP: No Oral Cavity - Dentition: Dentures, top;Dentures, bottom Vision: Functional for self-feeding Self-Feeding Abilities: Able to feed self;Needs set up Patient Positioning: Upright in bed Baseline Vocal Quality: Low vocal intensity Volitional Cough: Weak Volitional Swallow: Unable to elicit    Oral/Motor/Sensory Function Overall Oral Motor/Sensory Function: Generalized oral weakness   Ice Chips Ice chips: Impaired Presentation: Spoon Oral Phase Impairments: Reduced lingual movement/coordination Oral Phase Functional Implications: Prolonged oral transit;Oral holding Pharyngeal Phase Impairments: Suspected delayed Swallow   Thin Liquid Thin Liquid: Impaired Presentation: Self Fed;Cup;Straw;Spoon Oral Phase Impairments: Reduced lingual movement/coordination Pharyngeal  Phase Impairments: Multiple swallows;Throat Clearing - Immediate Other Comments: immediate throat clearing with sequential boluses - approximately 5 sequentially, no s/s with single boluses    Nectar Thick Nectar Thick Liquid: Not tested   Honey Thick Honey Thick Liquid: Not tested   Puree Puree: Impaired Presentation: Self Fed;Spoon Oral Phase Impairments: Reduced lingual movement/coordination Oral Phase Functional Implications: Prolonged oral transit Pharyngeal Phase Impairments: Suspected delayed Swallow;Multiple swallows   Solid   GO   Solid: Impaired Presentation: Self Fed Oral Phase Impairments: Impaired mastication;Reduced lingual movement/coordination Oral Phase Functional Implications:  Impaired mastication;Prolonged oral  transit;Oral residue;Oral holding        Mills KollerKimball, Braxdon Gappa Ann Journee Bobrowski, MS [facility] SLP [phone]

## 2016-09-26 NOTE — Progress Notes (Signed)
Pt discharged from the unit via wheelchair. Discharge instructions were given and reviewed with the pt daughter. No questions or concerns at this time.  Trisha Morandi W Keane Martelli, RN

## 2016-09-26 NOTE — Discharge Summary (Signed)
Discharge Summary  Michele Cervantes ZOX:[id number] DOB: 09/17/1929  PCP: Michele HeadingsMACKENZIE,BRIAN, MD  Admit date: 09/23/2016 Discharge date: 09/27/2016  Time spent: <230mins  Recommendations for Outpatient Follow-up:  1. F/u with PMD within C[facility] week  for hospital discharge follow up, repeat cbc/bmp at follow up 2. Maximize home health  Discharge Diagnoses:  Active Hospital Problems   Diagnosis Date Noted  . Altered mental status 09/23/2016  . Dementia associated with other underlying disease with behavioral disturbance   . Difficulty transferring from bed to chair   . FTT (failure to thrive) in adult   . Decubitus ulcer of elbow, stage 2   . Altered mental state 09/24/2016  . Somnolence 09/23/2016  . Acute on chronic diastolic CHF (congestive heart failure) (HCC) 09/23/2016    Resolved Hospital Problems   Diagnosis Date Noted Date Resolved  No resolved problems to display.    Discharge Condition: stable  Diet recommendation:   Dysphagia 3 (Mech soft);Thin liquid   Liquid Administration via: Straw;Cup Medication Administration: Whole meds with puree Compensations: Slow rate;Small sips/bites Postural Changes: Seated upright at 90 degrees;Remain upright for at least 30 minutes after po intake   There were no vitals filed for this visit.  History of present illness: ([name] Chief Complaint: Somnolence  HPI: Michele Cervantes is C[facility] 82 y.o. female with medical history significant of Alzheimer dementia, CHF and hypertension. She brought to the hospital because of right foot injury but she was found very somnolent. History was taken from daughter at bedside, patient goes to adult day center during week days, yesterday her daughter noticed that she is sleepy but she was able to get her up and able to eat, this morning she was washing her and some skin sloughed out of her right heel so she brought her to the hospital for further evaluation. Low-grade temp of  99.2 and then developed low temp of 96.8. Patient is somnolent, [facility] hospitalist contacted for observation.  ED Course:  Vitals: Temp of 99.2 and 96.8, rest LDL Labs: WNL, except hemoglobin of 10.9 appears to be chronically low. Imaging: CT head without findings, CXR cannot rule out RUL opacity Interventions: Started on broad-spectrum antibiotics by the ED.  Hospital Course:  Principal Problem:   Altered mental status Active Problems:   Somnolence   Acute on chronic diastolic CHF (congestive heart failure) (HCC)   Altered mental state   Dementia associated with other underlying disease with behavioral disturbance   Difficulty transferring from bed to chair   FTT (failure to thrive) in adult   Decubitus ulcer of elbow, stage 2  Somnolence likely combination of acute metabolic encephalopathy from aspiration pna /wound infection and progression of dementia Daughter report patient was somnolent two days ago, currently she is at her baseline aspiration precaution, swallow eval Seems more alert at discharge, but allover all still very slow to response, only answer simple questions, oriented to self, recognize her daughter.  Low-grade temp of 99.2 and then developed low temp of 96.8, concerning for sepsis initially, ua unremarkable, blood culture no growth, cxr "Mild right upper lobe infiltrate cannot be excluded. Low lung volumes with mild basilar atelectasis." she dose has wound Lactic acid wnl, she received vanc/zosyn in the ED,  abx changed to augmentin , she is to discharge on oral augmentin to finish abx  Treatment course Speech eval, aspiration precaution.  Acute on chronic Diastolic chf exacerbation: with bilateral lower extremity edema and right heal blister that ruptured:  Coreg,  lasix 40mg  iv bid Edema improving, legs start to wrinkle,   d/c on oral lasix 40 in am, 20 in pm, pmd to follow up and continue to titrate lasix according to edema and renal function.  Stage 2  pressure injury at the right heel: bilateral pressure redistribution heel boots and give Nursing guidance via the Orders for topical care of the Stage 2 pressure injury at the right heel  bilateral great toes. Mild Discoloration , need to be monitored, refer to vascular surgery if worsening, defer to pmd   Dementia, FTT advanced, family report patient has 24/7 care at home, case manager consulted Continue namenda, risperdal, trasozone She did not have any behavioral issues in the hospital. She is more awake thought still very slow in response at discharge, daughter states this is at baseline. Maximize home health.   Code Status: Full  Family Communication: patient and daughter daily  Disposition Plan: home with home health on 11/6   Consultants:  none  Procedures:  none  Antibiotics:  Vanc/ zosyn in the EDx1  augmentin from 11/4      Discharge Exam: BP (!) 122/44 (BP Location: Right Arm)   Pulse 69   Temp 98.4 F (36.9 C) (Oral)   Resp 16   SpO2 96%     General:  NAD, demented elderly, very slow in response  Cardiovascular: RRR  Respiratory: CTABL  Abdomen: Soft/ND/NT, positive BS  Musculoskeletal: bilateral lower  Edema improving,  right heal blister has ruptured, does not look infected, now has pressure redistribution boots on  Neuro: oriented to person, know she is in the hospital, very slow to response, per daughter this is her baseline   Discharge Instructions You were cared for by [facility] hospitalist during your hospital stay. If you have any questions about your discharge medications or the care you received while you were in the hospital after you are discharged, you can call the unit and asked to speak with the hospitalist on call if the hospitalist that took care of you is not available. Once you are discharged, your primary care physician will handle any further medical issues. Please note that NO REFILLS for any discharge medications will  be authorized once you are discharged, as it is imperative that you return to your primary care physician (or establish C[facility]antes relationship with Cerv[facility] primary care physician if you do not have one) for your aftercare needs so that they can reassess your need for medications and monitor your lab values.  Discharge Instructions    DME Other see comment    Complete by:  As directed    Hoyer lift x1   Discharge instructions    Complete by:  As directed    Diet instruction: Dysphagia 3 (Mech soft);Thin liquid   Liquid Administration via: Straw;Cup Medication Administration: Whole meds with puree Compensations: Slow rate;Small sips/bites Postural Changes: Seated upright at 90 degrees;Remain upright for at least 30 minutes after po intake   Face-to-face encounter (required for Medicare/Medicaid patients)    Complete by:  As directed    I Robson Trickey certify that this patient is under my care and that I, or [facility] nurse practitioner or physician's assistant working with me, had [facility] face-to-face encounter that meets the physician face-to-face encounter requirements with this patient on 09/24/2016. The encounter with the patient was in whole, or in part for the following medical condition(s) which is the primary reason for home health care (List medical condition): FTT, advanced dementia   The encounter with the  patient was in whole, or in part, for the following medical condition, which is the primary reason for home health care:  FTT   I certify that, based on my findings, the following services are medically necessary home health services:   Nursing Physical therapy Speech language pathology     Reason for Medically Necessary Home Health Services:  Skilled Nursing- Change/Decline in Patient Status   My clinical findings support the need for the above services:  Cognitive impairments, dementia, or mental confusion  that make it unsafe to leave home   Further, I certify that my clinical findings support that this  patient is homebound due to:  Can transfer bed to chair only   Home Health    Complete by:  As directed    To provide the following care/treatments:   Home Health Aide RN PT OT SLP     Increase activity slowly    Complete by:  As directed        Medication List    TAKE these medications   amoxicillin-clavulanate 875-125 MG tablet Commonly known as:  AUGMENTIN Take 1 tablet by mouth every 12 (twelve) hours.   aspirin 81 MG tablet Take 81 mg by mouth daily.   carvedilol 6.25 MG tablet Commonly known as:  COREG Take 6.25 mg by mouth 2 (two) times daily.   celecoxib 200 MG capsule Commonly known as:  CELEBREX TAKE ONE CAPSULE TWICE [facility] DAY AS NEEDED FOR PAIN   divalproex 250 MG DR tablet Commonly known as:  DEPAKOTE TAKE 1 TABLET (250 MG TOTAL) BY MOUTH 2 (TWO) TIMES DAILY.   furosemide 20 MG tablet Commonly known as:  LASIX Take 40mg  in am and 20mg  in pm What changed:  additional instructions   mirtazapine 30 MG tablet Commonly known as:  REMERON TAKE 1 TABLET (30 MG TOTAL) BY MOUTH DAILY.   NAMZARIC 28-10 MG Cp24 Generic drug:  Memantine HCl-Donepezil HCl Take 1 capsule by mouth daily.   pravastatin 10 MG tablet Commonly known as:  PRAVACHOL Take 10 mg by mouth at bedtime.   risperiDONE 1 MG tablet Commonly known as:  RISPERDAL Take 1 mg by mouth 2 (two) times daily.   ROBITUSSIN CHEST CONGESTION PO Take 10 mLs by mouth daily as needed (for cough).   senna-docusate 8.6-50 MG tablet Commonly known as:  Senokot-S Take 1 tablet by mouth at bedtime.   traZODone 100 MG tablet Commonly known as:  DESYREL TAKE 1 TABLET (100 MG TOTAL) BY MOUTH AT BEDTIME.            Durable Medical Equipment        Start     Ordered   09/24/16 0000  DME Other see comment    Comments:  Michiel Sites lift x1   09/24/16 1238     No Known Allergies Follow-up Information    MACKENZIE,BRIAN, MD Follow up in 1 week(s).   Specialty:  Internal Medicine Why:  hospital  discharge, repeat bmp, monitor renal function, continue adjust lasix dose for edema bilateral great toes Discoloration , need to be monitored, refer to vascular surgery if worsening  Contact information: [address] [place] [address] [phone]        [facility] Follow up.   Contact information: [address] [place] [address] [phone]            The results of significant diagnostics from this hospitalization (including imaging, microbiology, ancillary and laboratory) are listed below for  reference.    Significant Diagnostic Studies: Dg Chest 2 View  Result Date: 09/23/2016 CLINICAL DATA:  Progressive cough. EXAM: CHEST  2 VIEW COMPARISON:  03/15/2016. FINDINGS: Mediastinum and hilar structures normal. Mild infiltrate right upper lobe cannot be excluded. Low lung volumes. No pleural effusion or pneumothorax . IMPRESSION: 1.  Mild right upper lobe infiltrate cannot be excluded. 2. Low lung volumes with mild basilar atelectasis. Electronically Signed   By: Maisie Fus  Register   On: 09/23/2016 09:37   Ct Head Wo Contrast  Result Date: 09/23/2016 CLINICAL DATA:  Altered mental status EXAM: CT HEAD WITHOUT CONTRAST TECHNIQUE: Contiguous axial images were obtained from the base of the skull through the vertex without intravenous contrast. COMPARISON:  12/26/2015 head CT FINDINGS: Brain: No evidence of parenchymal hemorrhage or extra-axial fluid collection. No mass lesion, mass effect, or midline shift. No CT evidence of acute infarction. Intracranial atherosclerosis. Generalized cerebral volume loss. Nonspecific mild subcortical and periventricular white matter hypodensity, most in keeping with chronic small vessel ischemic change. Cerebral ventricle sizes are stable and concordant with the degree of cerebral volume loss. 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  Vascular: No hyperdense vessel or unexpected calcification. Skull: No evidence of calvarial fracture. Sinuses/Orbits: The visualized paranasal sinuses are essentially clear. Other:  The mastoid air cells are unopacified. IMPRESSION: 1.  No evidence of acute intracranial abnormality. 2. Generalized cerebral volume loss and mild chronic small vessel ischemia. Electronically Signed   By: Delbert Phenix M.D.   On: 09/23/2016 09:01    Microbiology: Recent Results (from the past 240 hour(s))  Blood Culture (routine x 2)     Status: None (Preliminary result)   Collection Time: 09/23/16  8:32 AM  Result Value Ref Range Status   Specimen Description BLOOD RIGHT HAND  Final   Special Requests BOTTLES DRAWN AEROBIC AND ANAEROBIC  Final   Culture   Final    NO GROWTH 4 DAYS Performed at [facility]    Report Status PENDING  Incomplete  Urine culture     Status: None   Collection Time: 09/23/16  9:58 AM  Result Value Ref Range Status   Specimen Description URINE, RANDOM  Final   Special Requests NONE  Final   Culture NO GROWTH Performed at [facility]   Final   Report Status 09/24/2016 FINAL  Final  MRSA PCR Screening     Status: None   Collection Time: 09/24/16  1:30 PM  Result Value Ref Range Status   MRSA by PCR NEGATIVE NEGATIVE Final    Comment:        The GeneXpert MRSA Assay (FDA approved for NASAL specimens only), is one component of C[facility] comprehensive MRSA colonization surveillance program. It is not intended to diagnose MRSA infection nor to guide or monitor treatment for MRSA infections.      Labs: Basic Metabolic Panel:  Recent Labs Lab 09/23/16 0832 09/23/16 0847 09/24/16 0532 09/25/16 0551 09/26/16 0516  NA 137 140 140 140 141  K 4.2 4.2 4.1 4.0 4.3  CL 104 100* 103 101 102  CO2 29  --  29 31 31   GLUCOSE 86 83 72 86 88  BUN 23* 22* 21* 31* 36*  CREATININE 0.91 1.00 0.75 0.91 1.07*  CALCIUM 8.5*  --  8.6* 8.6* 8.6*  MG  --   --   --   --  2.2     Liver Function  Tests:  Recent Labs Lab 09/23/16 0832  AST 14*  ALT 9*  ALKPHOS 60  BILITOT 0.5  PROT 6.1*  ALBUMIN 2.9*   No results for input(s): LIPASE, AMYLASE in the last 168 hours. No results for input(s): AMMONIA in the last 168 hours. CBC:  Recent Labs Lab 09/23/16 0832 09/23/16 0847 09/24/16 0532 09/25/16 0551 09/26/16 0516  WBC 5.9  --  5.8 7.1 6.9  NEUTROABS 3.1  --   --   --   --   HGB 10.9* 10.9* 10.7* 11.2* 10.7*  HCT 33.3* 32.0* 32.7* 34.2* 32.9*  MCV 91.2  --  91.6 90.7 91.9  PLT 266  --  271 308 299   Cardiac Enzymes: No results for input(s): CKTOTAL, CKMB, CKMBINDEX, TROPONINI in the last 168 hours. BNP: BNP (last 3 results)  Recent Labs  12/26/15 2112 03/15/16 1556  BNP 42.5 87.6    ProBNP (last 3 results) No results for input(s): PROBNP in the last 8760 hours.  CBG:  Recent Labs Lab 09/23/16 0914  GLUCAP 81       Signed:  Yonis Carreon MD, PhD  [facility] 09/27/2016, 11:06 PM

## 2016-09-26 NOTE — Progress Notes (Signed)
This CM spoke with pt daughter Dois DavenportSandra to continue with dc planning. Dois DavenportSandra is requesting a HHRN and Aide for home. Choice was offered and [facility]. [facility] rep contacted for referral. Dois DavenportSandra also requesting H[facility]ar at home. [facility]oyar lift order received and [facility] DME rep contacted to inform about order. No other CM needs communicated. CM will continue to follow. Sandford Crazeora Dynver Clemson RN,BSN,NCM [phone]

## 2016-09-28 LAB — CULTURE, BLOOD (ROUTINE X 2): Culture: NO GROWTH

## 2016-11-21 DEATH — deceased

## 2016-12-20 IMAGING — CR DG CHEST 2V
2 series · 2 of 2 positions shown · non-contrast
Comparison: None.

CLINICAL DATA: Family member states that patient has had an
increased altered mental status x 1 week. Also states pt has had a
mild cough x 2 weeks. Hx of dementia, htn and chf.

EXAM:
CHEST  2 VIEW

[w chest pa]
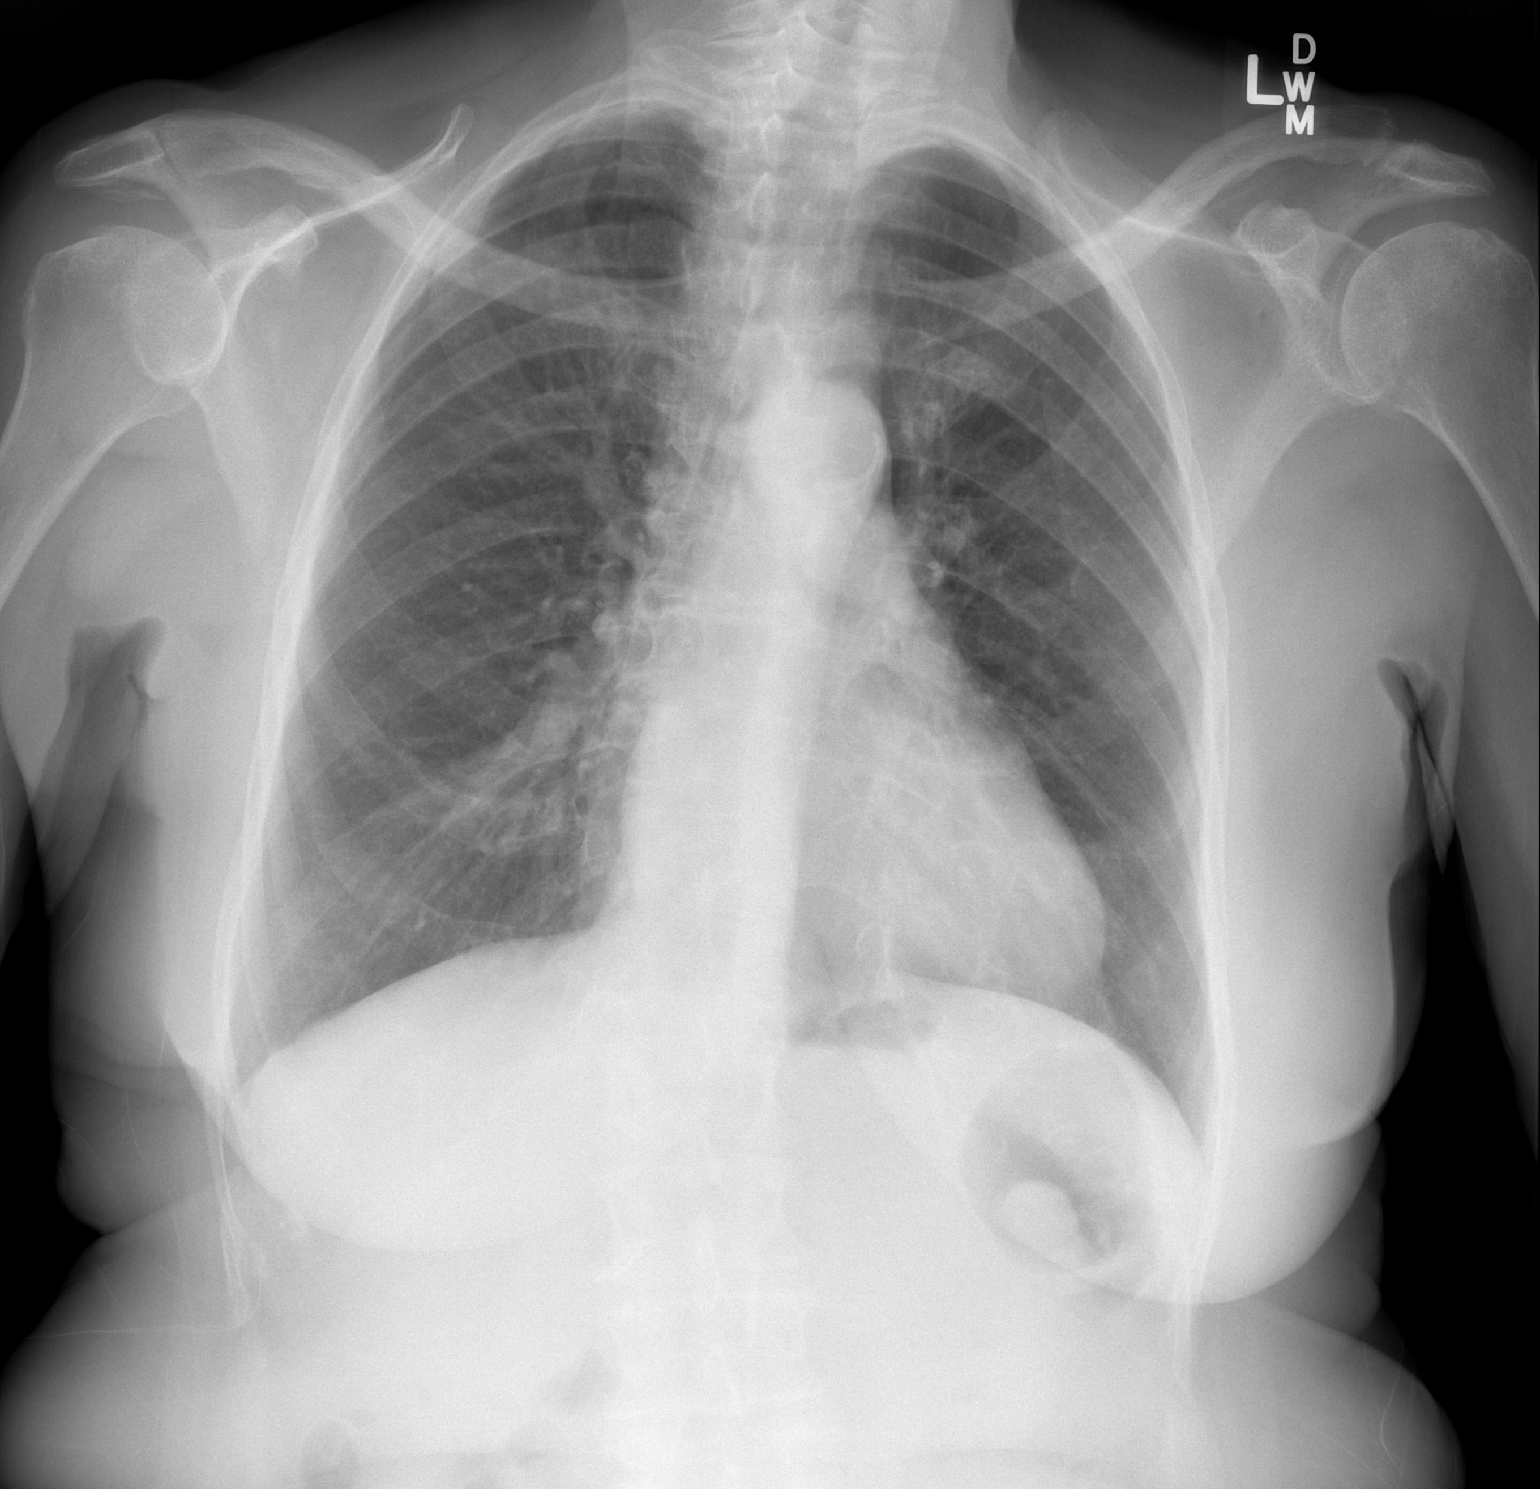

[w chest lat]
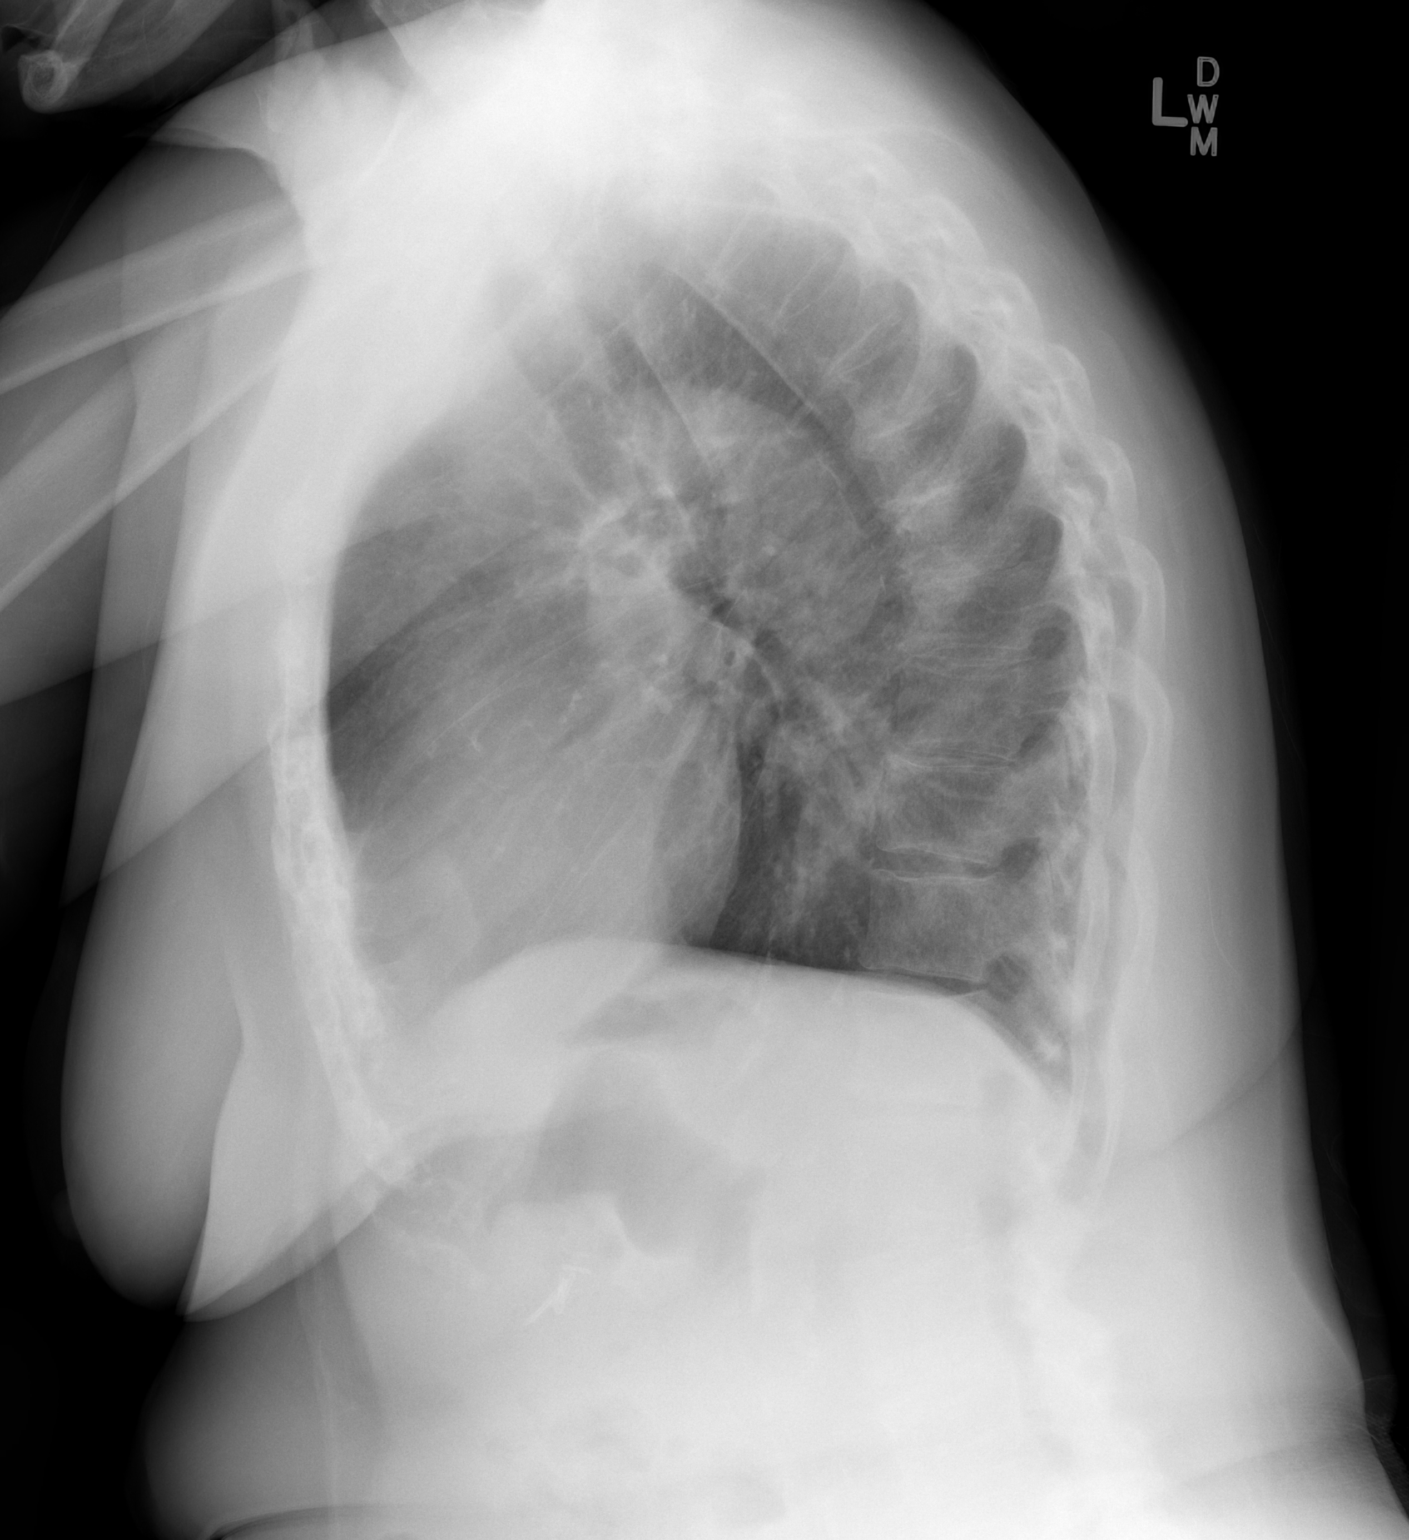

[2 of 2 positions shown; findings below may reference images not displayed]

FINDINGS: Cardiac silhouette is normal in size. Normal mediastinal and hilar
contours.

Lungs are mildly hyperexpanded but clear. No pleural effusion or
pneumothorax.

Bony thorax is demineralized. There are mild wedge-shaped
compression deformities of mid thoracic vertebrae, likely old.
IMPRESSION: No acute cardiopulmonary disease.

## 2017-01-23 ENCOUNTER — Ambulatory Visit: Payer: Medicare PPO | Admitting: Neurology

## 2017-01-23 ENCOUNTER — Telehealth: Payer: Self-pay | Admitting: Neurology

## 2017-01-23 NOTE — Telephone Encounter (Signed)
This patient did not show for a revisit appointment today. 

## 2017-05-10 IMAGING — CR DG CHEST 2V
2 series · 2 of 2 positions shown · non-contrast
Comparison: 07/06/2015

CLINICAL DATA: Cough and shortness of breath for 3 days.

EXAM:
CHEST  2 VIEW

[w chest pa]
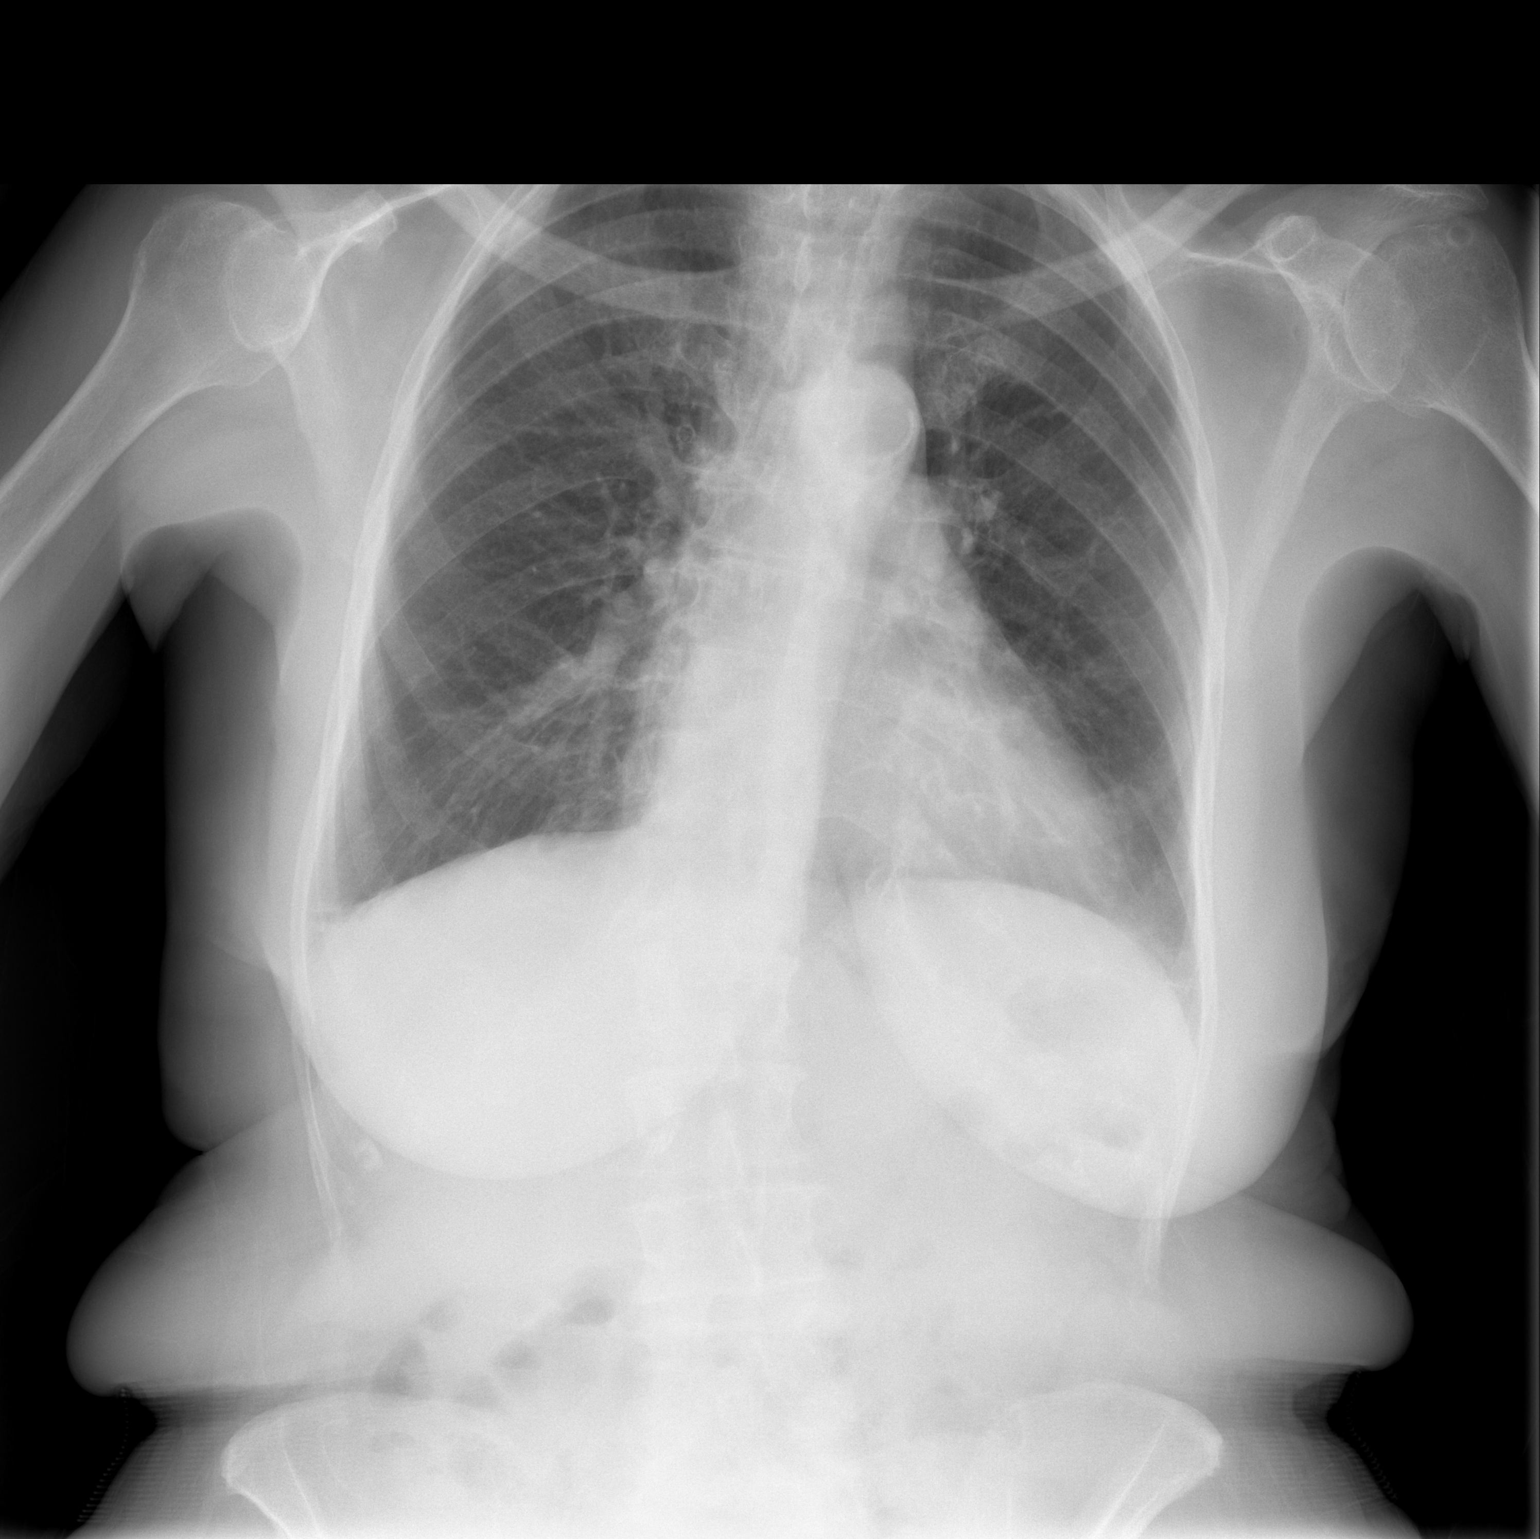

[w chest lat]
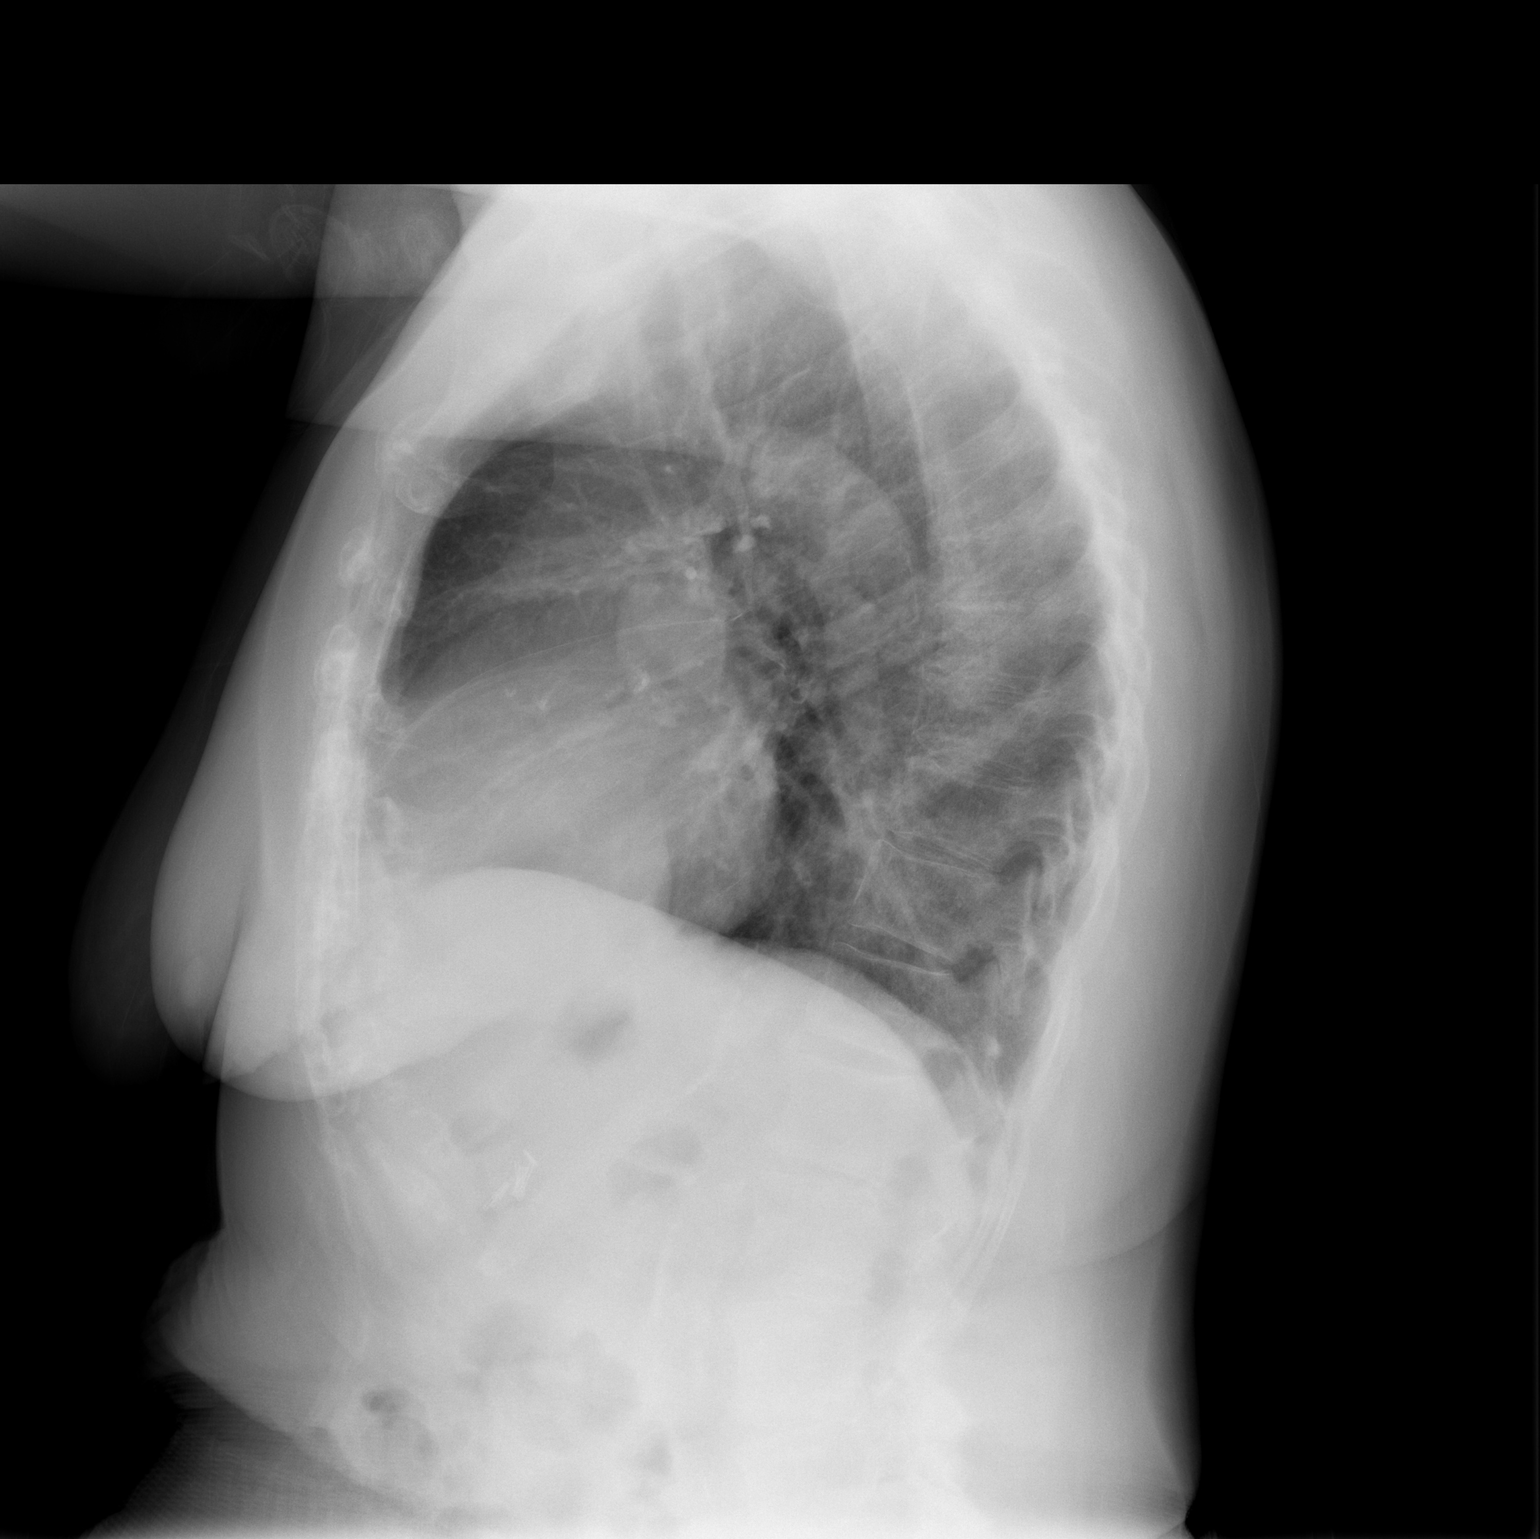

[2 of 2 positions shown; findings below may reference images not displayed]

FINDINGS: The cardiomediastinal contours are normal. Mild chronic
hyperinflation is unchanged. Pulmonary vasculature is normal. No
consolidation, pleural effusion, or pneumothorax. No acute osseous
abnormalities are seen.
IMPRESSION: No acute pulmonary process.

## 2017-05-29 IMAGING — CR DG CHEST 2V
2 series · 2 of 2 positions shown · non-contrast
Comparison: 11/24/2015

CLINICAL DATA: Pre admission testing

EXAM:
CHEST - 2 VIEW

[w chest pa]
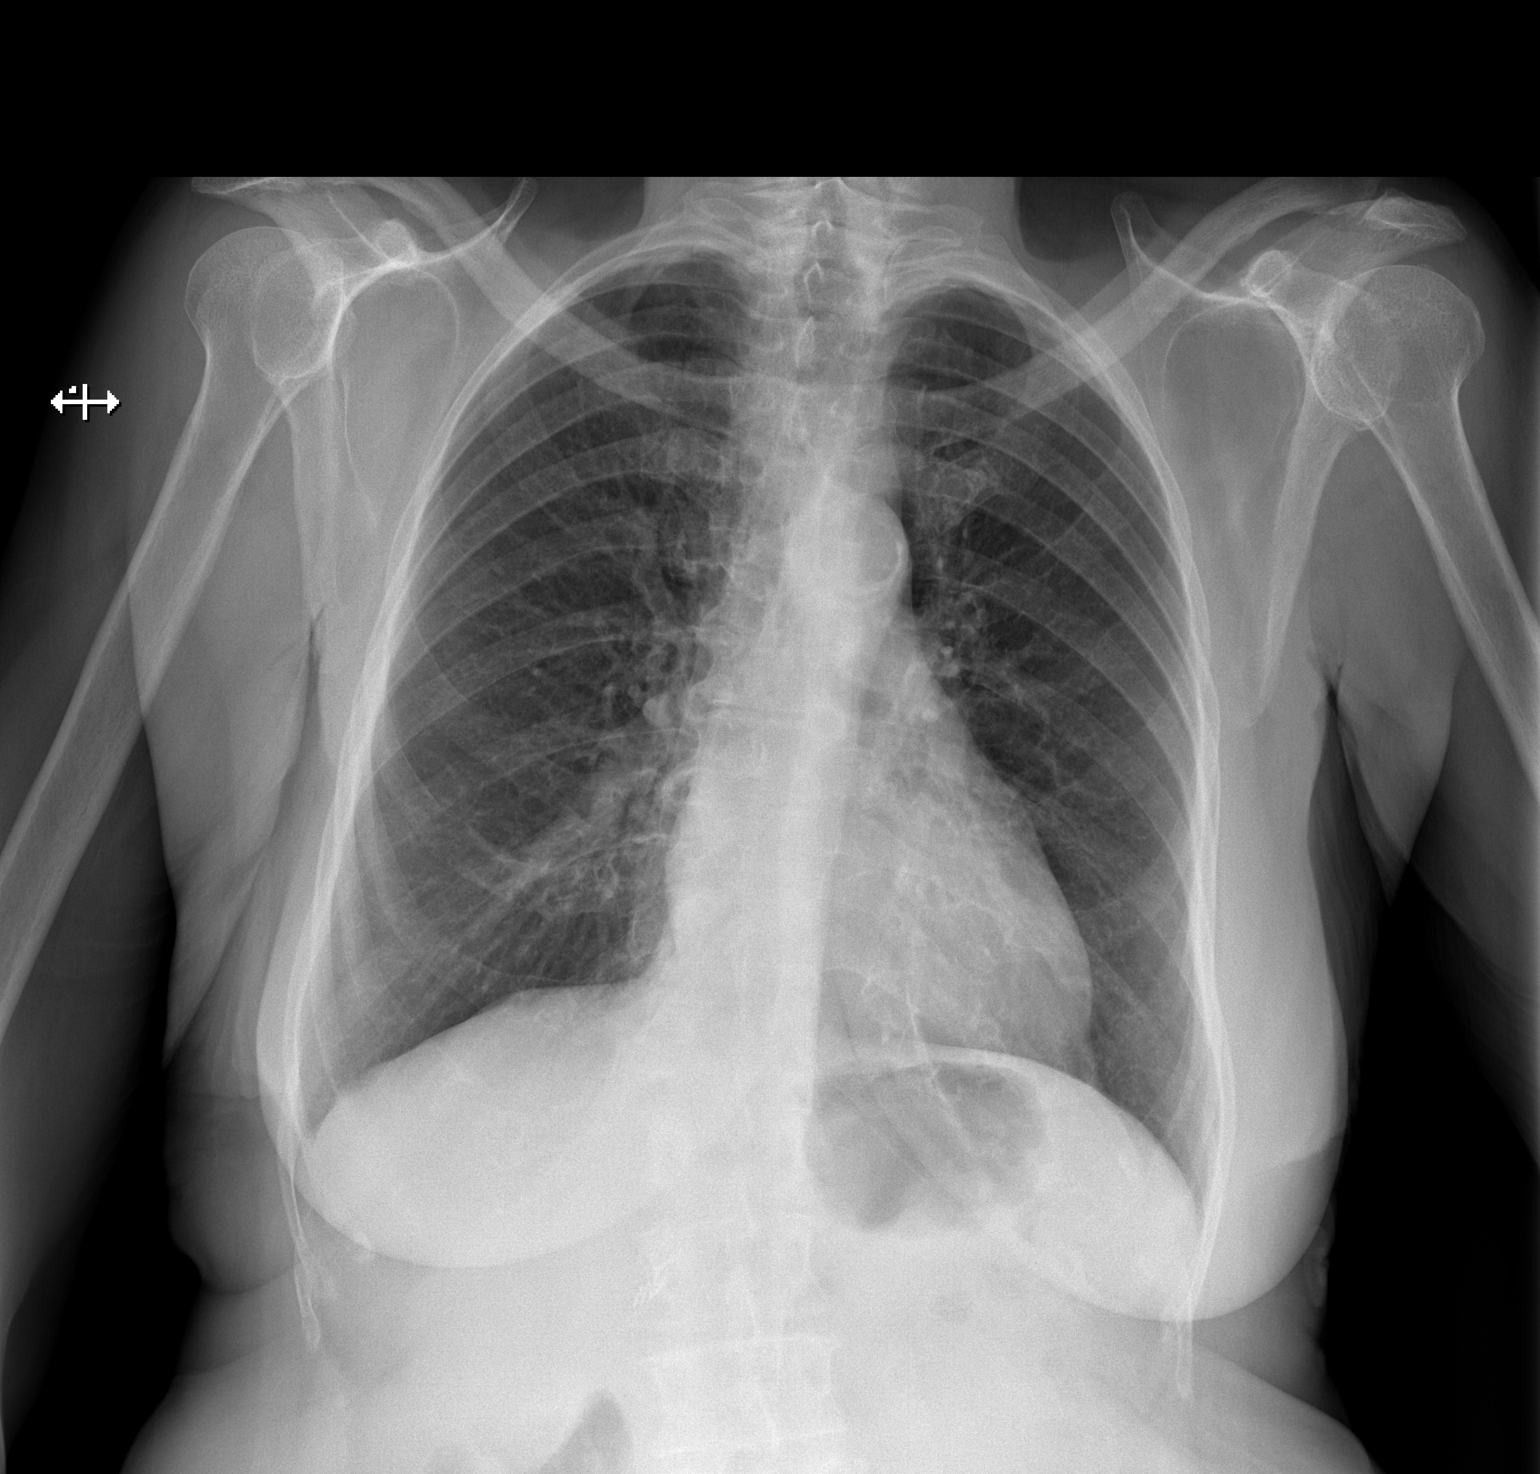

[w chest lat]
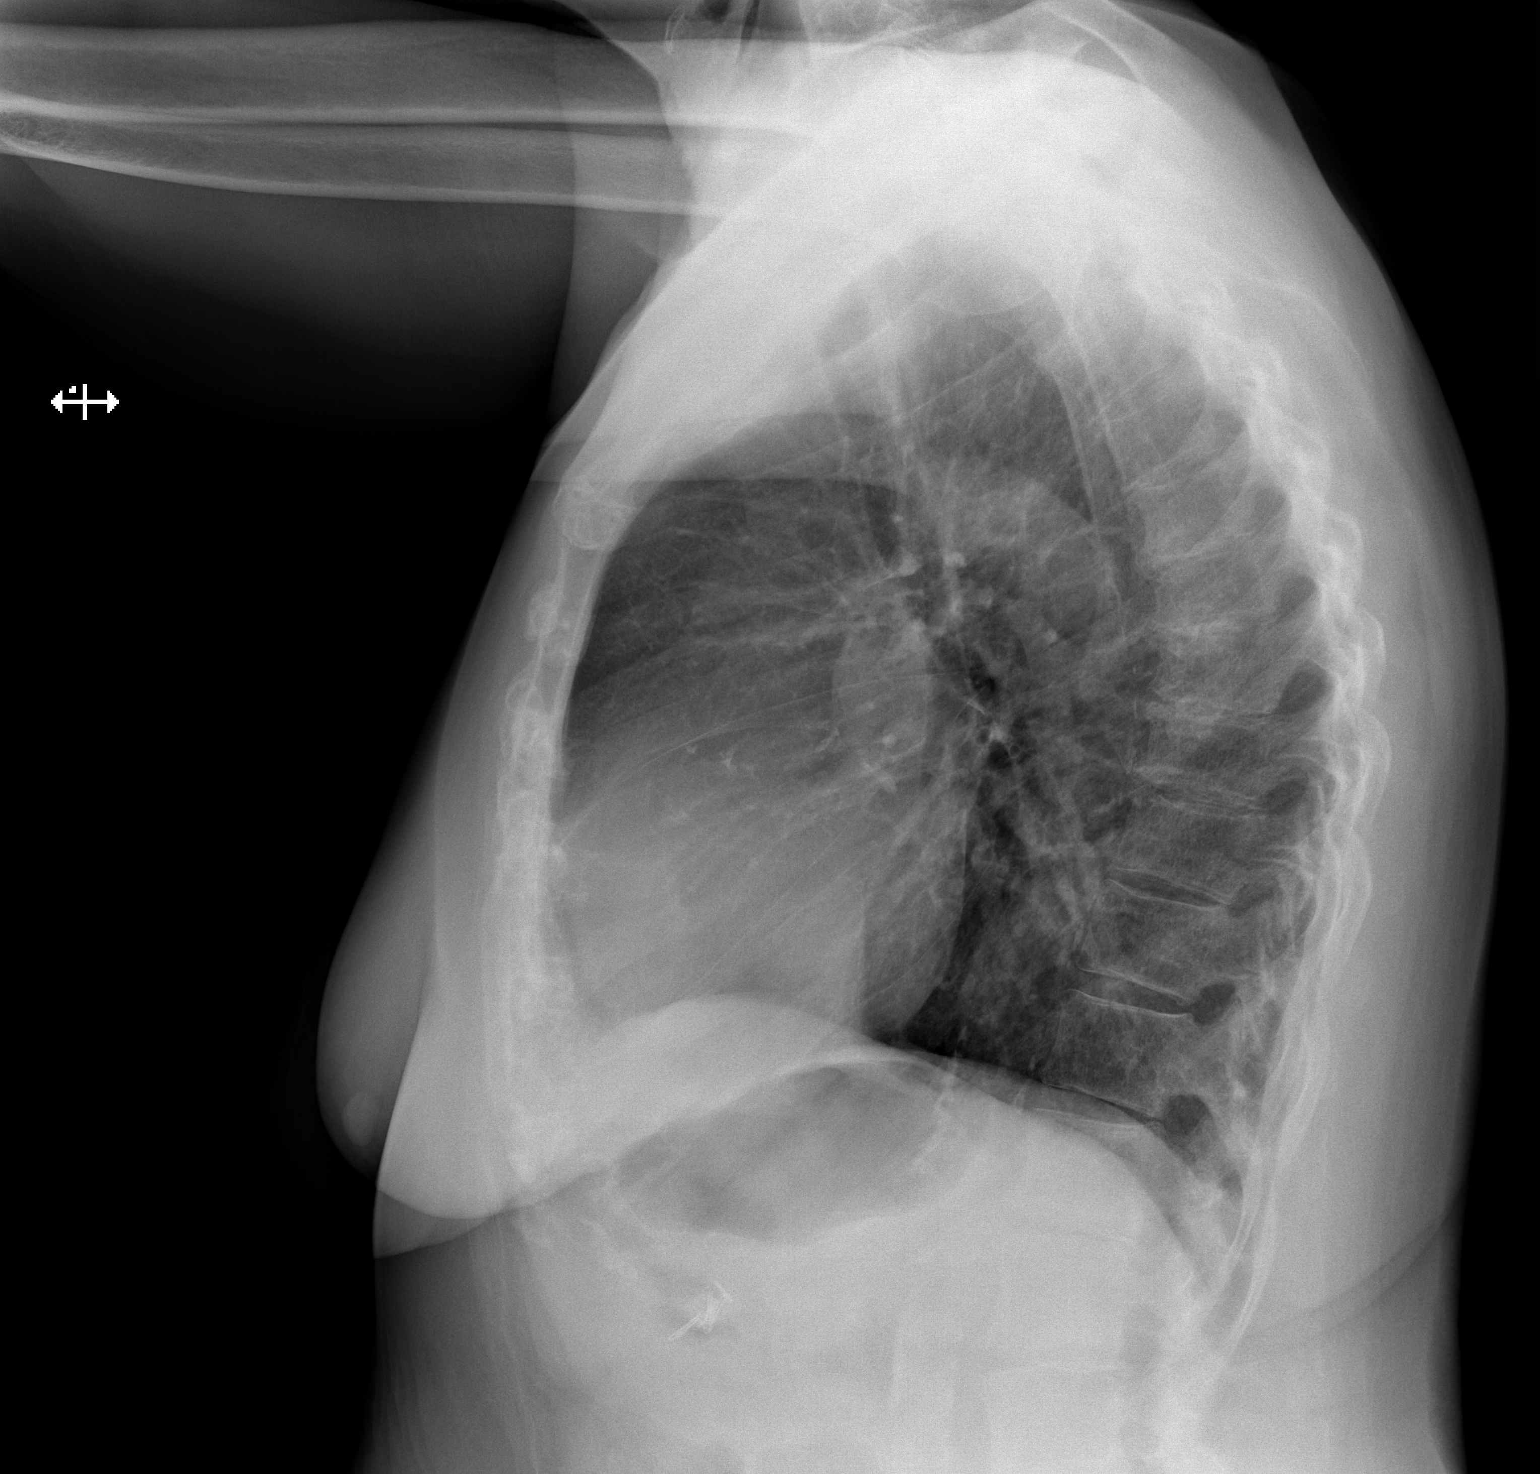

[2 of 2 positions shown; findings below may reference images not displayed]

FINDINGS: The heart size and mediastinal contours are within normal limits.
Both lungs are clear. The visualized skeletal structures are
unremarkable.
IMPRESSION: No active disease.
